# Patient Record
Sex: Female | Born: 1970 | Race: White | Hispanic: No | Marital: Married | Smoking: Former smoker
Health system: Southern US, Community
[De-identification: ages and names within clinical notes are randomized; demographics above are authoritative.]

## PROBLEM LIST (undated history)

## (undated) DIAGNOSIS — T7840XA Allergy, unspecified, initial encounter: Secondary | ICD-10-CM

## (undated) DIAGNOSIS — G5602 Carpal tunnel syndrome, left upper limb: Secondary | ICD-10-CM

## (undated) DIAGNOSIS — R7611 Nonspecific reaction to tuberculin skin test without active tuberculosis: Secondary | ICD-10-CM

## (undated) DIAGNOSIS — K635 Polyp of colon: Secondary | ICD-10-CM

## (undated) DIAGNOSIS — B019 Varicella without complication: Secondary | ICD-10-CM

## (undated) DIAGNOSIS — M1990 Unspecified osteoarthritis, unspecified site: Secondary | ICD-10-CM

## (undated) DIAGNOSIS — Z87891 Personal history of nicotine dependence: Secondary | ICD-10-CM

## (undated) DIAGNOSIS — E782 Mixed hyperlipidemia: Secondary | ICD-10-CM

## (undated) DIAGNOSIS — M5412 Radiculopathy, cervical region: Secondary | ICD-10-CM

## (undated) HISTORY — DX: Varicella without complication: B01.9

## (undated) HISTORY — PX: WISDOM TOOTH EXTRACTION: SHX21

## (undated) HISTORY — DX: Unspecified osteoarthritis, unspecified site: M19.90

## (undated) HISTORY — PX: OTHER SURGICAL HISTORY: SHX169

## (undated) HISTORY — DX: Nonspecific reaction to tuberculin skin test without active tuberculosis: R76.11

## (undated) HISTORY — DX: Allergy, unspecified, initial encounter: T78.40XA

---

## 1998-05-27 HISTORY — PX: WRIST GANGLION EXCISION: SUR520

## 2014-09-20 ENCOUNTER — Ambulatory Visit (INDEPENDENT_AMBULATORY_CARE_PROVIDER_SITE_OTHER): Payer: BC Managed Care – PPO | Admitting: Internal Medicine

## 2014-09-20 ENCOUNTER — Encounter: Payer: Self-pay | Admitting: Internal Medicine

## 2014-09-20 VITALS — BP 106/68 | HR 60 | Temp 98.6°F | Ht 62.5 in | Wt 143.0 lb

## 2014-09-20 DIAGNOSIS — M1990 Unspecified osteoarthritis, unspecified site: Secondary | ICD-10-CM | POA: Insufficient documentation

## 2014-09-20 DIAGNOSIS — Z0000 Encounter for general adult medical examination without abnormal findings: Secondary | ICD-10-CM

## 2014-09-20 DIAGNOSIS — Z111 Encounter for screening for respiratory tuberculosis: Secondary | ICD-10-CM

## 2014-09-20 DIAGNOSIS — J302 Other seasonal allergic rhinitis: Secondary | ICD-10-CM

## 2014-09-20 DIAGNOSIS — E663 Overweight: Secondary | ICD-10-CM | POA: Diagnosis not present

## 2014-09-20 LAB — COMPREHENSIVE METABOLIC PANEL
ALBUMIN: 4.5 g/dL (ref 3.5–5.2)
ALK PHOS: 43 U/L (ref 39–117)
ALT: 16 U/L (ref 0–35)
AST: 23 U/L (ref 0–37)
BUN: 9 mg/dL (ref 6–23)
CHLORIDE: 101 meq/L (ref 96–112)
CO2: 32 mEq/L (ref 19–32)
Calcium: 9.4 mg/dL (ref 8.4–10.5)
Creatinine, Ser: 0.66 mg/dL (ref 0.40–1.20)
GFR: 104.5 mL/min (ref >60.00)
Glucose, Bld: 89 mg/dL (ref 70–99)
Potassium: 4.2 mEq/L (ref 3.5–5.1)
Sodium: 136 mEq/L (ref 135–145)
Total Bilirubin: 0.6 mg/dL (ref 0.2–1.2)
Total Protein: 7.3 g/dL (ref 6.0–8.3)

## 2014-09-20 LAB — CBC
HCT: 42.8 % (ref 36.0–46.0)
HEMOGLOBIN: 14.6 g/dL (ref 12.0–15.0)
MCHC: 34.1 g/dL (ref 30.0–36.0)
MCV: 92.4 fl (ref 78.0–100.0)
Platelets: 216 10*3/uL (ref 150.0–400.0)
RBC: 4.63 Mil/uL (ref 3.87–5.11)
RDW: 13.2 % (ref 11.5–15.5)
WBC: 4 10*3/uL (ref 4.0–10.5)

## 2014-09-20 LAB — LIPID PANEL
Cholesterol: 192 mg/dL (ref 0–200)
HDL: 86.6 mg/dL (ref >39.00)
LDL CALC: 90 mg/dL (ref 0–99)
NonHDL: 105.4
TRIGLYCERIDES: 76 mg/dL (ref 0.0–149.0)
Total CHOL/HDL Ratio: 2
VLDL: 15.2 mg/dL (ref 0.0–40.0)

## 2014-09-20 NOTE — Progress Notes (Signed)
Pre visit review using our clinic review tool, if applicable. No additional management support is needed unless otherwise documented below in the visit note. 

## 2014-09-20 NOTE — Assessment & Plan Note (Signed)
Controlled with Benadryl prn

## 2014-09-20 NOTE — Assessment & Plan Note (Signed)
Controlled with Ibuprofen prn

## 2014-09-20 NOTE — Progress Notes (Signed)
HPI  Pt presents to the clinic today to establish care and for management of the conditions listed below. She also needs a physical form filled out for her employment, and she would like to get that done to day if possible.  Arthritis: mainly in her neck, shoulders and hands. Will occasionally take Ibuprofen with good relief.  Seasonal Allergies: Worse in the spring and in the fall. She takes Benadryl as needed if they are really bad.  Flu: > 2 years ago Tetanus: < 10 years ago LMP:09/16/14 still regular Pap Smear: 2011 Mammogram: never Vision Screening: 2012 Dentist: annually  Past Medical History  Diagnosis Date  . Arthritis   . Chicken pox   . Allergy   . Positive TB test     Current Outpatient Prescriptions  Medication Sig Dispense Refill  . Melatonin 5 MG CAPS Take 1 capsule by mouth at bedtime as needed and may repeat dose one time if needed.    . Multiple Vitamins-Minerals (MULTIVITAMIN ADULT PO) Take 1 capsule by mouth daily.    . NON FORMULARY Take 1 capsule by mouth daily. Supplement Holy Basil     No current facility-administered medications for this visit.    Allergies  Allergen Reactions  . Naproxen Sodium [Naproxen] Other (See Comments)    indigestion     Family History  Problem Relation Age of Onset  . Diabetes Mother   . Arthritis Father   . Cancer Father     Lung  . Arthritis Paternal Grandmother   . Cancer Paternal Grandmother     Lung  . Diabetes Paternal Grandmother     History   Social History  . Marital Status: Single    Spouse Name: N/A  . Number of Children: N/A  . Years of Education: N/A   Occupational History  . Not on file.   Social History Main Topics  . Smoking status: Former Research scientist (life sciences)  . Smokeless tobacco: Never Used     Comment: quit 8 years ago  . Alcohol Use: No     Comment: occasional  . Drug Use: No  . Sexual Activity: Not on file   Other Topics Concern  . Not on file   Social History Narrative  . No narrative  on file    ROS:  Constitutional: Denies fever, malaise, fatigue, headache or abrupt weight changes.  HEENT: Denies eye pain, eye redness, ear pain, ringing in the ears, wax buildup, runny nose, nasal congestion, bloody nose, or sore throat. Respiratory: Denies difficulty breathing, shortness of breath, cough or sputum production.   Cardiovascular: Denies chest pain, chest tightness, palpitations or swelling in the hands or feet.  Gastrointestinal: Denies abdominal pain, bloating, constipation, diarrhea or blood in the stool.  GU: Denies frequency, urgency, pain with urination, blood in urine, odor or discharge. Musculoskeletal: Denies decrease in range of motion, difficulty with gait, muscle pain or joint pain and swelling.  Skin: Denies redness, rashes, lesions or ulcercations.  Neurological: Denies dizziness, difficulty with memory, difficulty with speech or problems with balance and coordination.  Psych: Denies anxiety, depression, SI/HI.  No other specific complaints in a complete review of systems (except as listed in HPI above).  PE:  BP 106/68 mmHg  Pulse 60  Temp(Src) 98.6 F (37 C) (Oral)  Ht 5' 2.5" (1.588 m)  Wt 143 lb (64.864 kg)  BMI 25.72 kg/m2  SpO2 99%  LMP 09/14/2014  Wt Readings from Last 3 Encounters:  09/20/14 143 lb (64.864 kg)    General:  Appears her stated age, well developed, well nourished in NAD. HEENT: Head: normal shape and size; Eyes: sclera white, no icterus, conjunctiva pink, PERRLA and EOMs intact; Ears: Tm's gray and intact, normal light reflex; Nose: mucosa pink and moist, septum midline; Throat/Mouth: Teeth present, mucosa pink and moist, no lesions or ulcerations noted.  Neck: Neck supple, trachea midline. No masses, lumps or thyromegaly present.  Cardiovascular: Normal rate and rhythm. S1,S2 noted.  No murmur, rubs or gallops noted. No JVD or BLE edema. No carotid bruits noted. Pulmonary/Chest: Normal effort and positive vesicular breath  sounds. No respiratory distress. No wheezes, rales or ronchi noted.  Abdomen: Soft and nontender. Normal bowel sounds, no bruits noted. No distention or masses noted. Liver, spleen and kidneys non palpable. Musculoskeletal: Strength 5/5 BUE/BLE. No signs of joint swelling. No difficulty with gait.  Neurological: Alert and oriented. Cranial nerves II-XII grossly intact. Coordination normal.  Psychiatric: Mood and affect normal. Behavior is normal. Judgment and thought content normal.     Assessment and Plan:  Preventative Health Maintenance:  Encouraged her to work on diet and exercise She is slightly overweight, she wants to be referred to a nutritionist, referral placed today. Will check CBC, CMET and Lipid Profile today Will check Quantiferon TB assay for the form that needs to be filled out (She has a history of positive TB skin test), chest xray's yearly have been negative) Vision screen today Will refer to [facility] for Mammogram and Pap Smear  RTC in 1 year or sooner if needed

## 2014-09-20 NOTE — Patient Instructions (Signed)

## 2014-09-23 LAB — QUANTIFERON TB GOLD ASSAY (BLOOD)
Interferon Gamma Release Assay: NEGATIVE
QUANTIFERON TB AG MINUS NIL: 0 [IU]/mL
Quantiferon Nil Value: 0.09 IU/mL
TB Ag value: 0.04 IU/mL

## 2015-03-31 ENCOUNTER — Encounter: Payer: Self-pay | Admitting: Internal Medicine

## 2015-03-31 ENCOUNTER — Ambulatory Visit (INDEPENDENT_AMBULATORY_CARE_PROVIDER_SITE_OTHER): Payer: BC Managed Care – PPO | Admitting: Internal Medicine

## 2015-03-31 VITALS — BP 122/72 | HR 65 | Temp 98.5°F | Resp 12 | Wt 138.5 lb

## 2015-03-31 DIAGNOSIS — J209 Acute bronchitis, unspecified: Secondary | ICD-10-CM | POA: Insufficient documentation

## 2015-03-31 MED ORDER — AZITHROMYCIN 250 MG PO TABS: ORAL_TABLET | ORAL | Status: DC

## 2015-03-31 NOTE — Assessment & Plan Note (Signed)
From time course, atypical like Mycoplasma is likely Discussed supportive care Will try z-pak after discussion of pros/cons

## 2015-03-31 NOTE — Progress Notes (Signed)
   Subjective:    Patient ID: Tonya Ruiz, female    DOB: [birth year], 44 y.o.   MRN: [id number]  HPI Here due to respiratory illness Symptoms started about 4 weeks ago--coming and going Voice goes away-then improves Sore throat only sporadically Gets stuffy and dry cough No chest congestion Some drainage No headaches No ear pain--but occasional pressure No fever At times, excessive cough--but dry Some wheezing before-not now No SOB  Has tried some OTC meds-- slight help with cough med  Current Outpatient Prescriptions on File Prior to Visit  Medication Sig Dispense Refill  . Melatonin 5 MG CAPS Take 1 capsule by mouth at bedtime as needed and may repeat dose one time if needed.    . Multiple Vitamins-Minerals (MULTIVITAMIN ADULT PO) Take 1 capsule by mouth daily.    . NON FORMULARY Take 1 capsule by mouth daily. Supplement Holy Basil     No current facility-administered medications on file prior to visit.    Allergies  Allergen Reactions  . Naproxen Sodium [Naproxen] Other (See Comments)    indigestion     Past Medical History  Diagnosis Date  . Arthritis   . Chicken pox   . Allergy   . Positive TB test     Past Surgical History  Procedure Laterality Date  . Wrist ganglion excision Left 2000  . Wisdom teeth      Family History  Problem Relation Age of Onset  . Diabetes Mother   . Arthritis Father   . Cancer Father     Lung  . Arthritis Paternal Grandmother   . Cancer Paternal Grandmother     Lung  . Diabetes Paternal Grandmother     Social History   Social History  . Marital Status: Single    Spouse Name: N/A  . Number of Children: N/A  . Years of Education: N/A   Occupational History  . Not on file.   Social History Main Topics  . Smoking status: Former Research scientist (life sciences)  . Smokeless tobacco: Never Used     Comment: quit 8 years ago  . Alcohol Use: No     Comment: occasional  . Drug Use: No  . Sexual Activity: Yes   Other Topics Concern  .  Not on file   Social History Narrative   Review of Systems No rash Loose stools at start---not recently No vomiting Appetite is okay    Objective:   Physical Exam  Constitutional: She appears well-developed and well-nourished. No distress.  HENT:  Mouth/Throat: Oropharynx is clear and moist. No oropharyngeal exudate.  No sinus tenderness Mild nasal swelling TMs normal  Neck: Normal range of motion. Neck supple.  Pulmonary/Chest: Effort normal and breath sounds normal. No respiratory distress. She has no wheezes. She has no rales.  Lymphadenopathy:    She has no cervical adenopathy.          Assessment & Plan:

## 2015-03-31 NOTE — Progress Notes (Signed)
Pre visit review using our clinic review tool, if applicable. No additional management support is needed unless otherwise documented below in the visit note. 

## 2015-11-13 ENCOUNTER — Encounter: Payer: Self-pay | Admitting: Internal Medicine

## 2015-11-13 ENCOUNTER — Other Ambulatory Visit (HOSPITAL_COMMUNITY)
Admission: RE | Admit: 2015-11-13 | Discharge: 2015-11-13 | Disposition: A | Discharge status: Home / Self Care | Payer: BC Managed Care – PPO | Source: Ambulatory Visit | Attending: Internal Medicine | Admitting: Internal Medicine

## 2015-11-13 ENCOUNTER — Ambulatory Visit (INDEPENDENT_AMBULATORY_CARE_PROVIDER_SITE_OTHER): Payer: BC Managed Care – PPO | Admitting: Internal Medicine

## 2015-11-13 VITALS — BP 116/70 | HR 65 | Temp 98.6°F | Ht 61.5 in | Wt 140.8 lb

## 2015-11-13 DIAGNOSIS — Z0001 Encounter for general adult medical examination with abnormal findings: Secondary | ICD-10-CM

## 2015-11-13 DIAGNOSIS — L989 Disorder of the skin and subcutaneous tissue, unspecified: Secondary | ICD-10-CM

## 2015-11-13 DIAGNOSIS — Z111 Encounter for screening for respiratory tuberculosis: Secondary | ICD-10-CM

## 2015-11-13 DIAGNOSIS — Z01419 Encounter for gynecological examination (general) (routine) without abnormal findings: Secondary | ICD-10-CM

## 2015-11-13 DIAGNOSIS — Z1151 Encounter for screening for human papillomavirus (HPV): Secondary | ICD-10-CM | POA: Insufficient documentation

## 2015-11-13 LAB — LIPID PANEL
CHOL/HDL RATIO: 2
CHOLESTEROL: 171 mg/dL (ref 0–200)
HDL: 75.6 mg/dL (ref >39.00)
LDL CALC: 79 mg/dL (ref 0–99)
NonHDL: 95.72
Triglycerides: 85 mg/dL (ref 0.0–149.0)
VLDL: 17 mg/dL (ref 0.0–40.0)

## 2015-11-13 LAB — COMPREHENSIVE METABOLIC PANEL
ALBUMIN: 4.2 g/dL (ref 3.5–5.2)
ALT: 11 U/L (ref 0–35)
AST: 17 U/L (ref 0–37)
Alkaline Phosphatase: 39 U/L (ref 39–117)
BUN: 10 mg/dL (ref 6–23)
CHLORIDE: 102 meq/L (ref 96–112)
CO2: 28 mEq/L (ref 19–32)
CREATININE: 0.66 mg/dL (ref 0.40–1.20)
Calcium: 9.1 mg/dL (ref 8.4–10.5)
GFR: 103.92 mL/min (ref >60.00)
Glucose, Bld: 87 mg/dL (ref 70–99)
Potassium: 4.2 mEq/L (ref 3.5–5.1)
SODIUM: 138 meq/L (ref 135–145)
TOTAL PROTEIN: 7.1 g/dL (ref 6.0–8.3)
Total Bilirubin: 0.6 mg/dL (ref 0.2–1.2)

## 2015-11-13 LAB — CBC
HEMATOCRIT: 40.4 % (ref 36.0–46.0)
Hemoglobin: 13.9 g/dL (ref 12.0–15.0)
MCHC: 34.3 g/dL (ref 30.0–36.0)
MCV: 92.8 fl (ref 78.0–100.0)
PLATELETS: 216 10*3/uL (ref 150.0–400.0)
RBC: 4.35 Mil/uL (ref 3.87–5.11)
RDW: 12.9 % (ref 11.5–15.5)
WBC: 6.2 10*3/uL (ref 4.0–10.5)

## 2015-11-13 NOTE — Patient Instructions (Signed)
Health Maintenance, Female Adopting a healthy lifestyle and getting preventive care can go a long way to promote health and wellness. Talk with your health care provider about what schedule of regular examinations is right for you. This is a good chance for you to check in with your provider about disease prevention and staying healthy. In between checkups, there are plenty of things you can do on your own. Experts have done a lot of research about which lifestyle changes and preventive measures are most likely to keep you healthy. Ask your health care provider for more information. WEIGHT AND DIET  Eat a healthy diet  Be sure to include plenty of vegetables, fruits, low-fat dairy products, and lean protein.  Do not eat a lot of foods high in solid fats, added sugars, or salt.  Get regular exercise. This is one of the most important things you can do for your health.  Most adults should exercise for at least 150 minutes each week. The exercise should increase your heart rate and make you sweat (moderate-intensity exercise).  Most adults should also do strengthening exercises at least twice a week. This is in addition to the moderate-intensity exercise.  Maintain a healthy weight  Body mass index (BMI) is a measurement that can be used to identify possible weight problems. It estimates body fat based on height and weight. Your health care provider can help determine your BMI and help you achieve or maintain a healthy weight.  For females 20 years of age and older:   A BMI below 18.5 is considered underweight.  A BMI of 18.5 to 24.9 is normal.  A BMI of 25 to 29.9 is considered overweight.  A BMI of 30 and above is considered obese.  Watch levels of cholesterol and blood lipids  You should start having your blood tested for lipids and cholesterol at 43 years of age, then have this test every 5 years.  You may need to have your cholesterol levels checked more often if:  Your lipid  or cholesterol levels are high.  You are older than 43 years of age.  You are at high risk for heart disease.  CANCER SCREENING   Lung Cancer  Lung cancer screening is recommended for adults 55-80 years old who are at high risk for lung cancer because of a history of smoking.  A yearly low-dose CT scan of the lungs is recommended for people who:  Currently smoke.  Have quit within the past 15 years.  Have at least a 30-pack-year history of smoking. A pack year is smoking an average of one pack of cigarettes a day for 1 year.  Yearly screening should continue until it has been 15 years since you quit.  Yearly screening should stop if you develop a health problem that would prevent you from having lung cancer treatment.  Breast Cancer  Practice breast self-awareness. This means understanding how your breasts normally appear and feel.  It also means doing regular breast self-exams. Let your health care provider know about any changes, no matter how small.  If you are in your 20s or 30s, you should have a clinical breast exam (CBE) by a health care provider every 1-3 years as part of a regular health exam.  If you are 40 or older, have a CBE every year. Also consider having a breast X-ray (mammogram) every year.  If you have a family history of breast cancer, talk to your health care provider about genetic screening.  If you   are at high risk for breast cancer, talk to your health care provider about having an MRI and a mammogram every year.  Breast cancer gene (BRCA) assessment is recommended for women who have family members with BRCA-related cancers. BRCA-related cancers include:  Breast.  Ovarian.  Tubal.  Peritoneal cancers.  Results of the assessment will determine the need for genetic counseling and BRCA1 and BRCA2 testing. Cervical Cancer Your health care provider may recommend that you be screened regularly for cancer of the pelvic organs (ovaries, uterus, and  vagina). This screening involves a pelvic examination, including checking for microscopic changes to the surface of your cervix (Pap test). You may be encouraged to have this screening done every 3 years, beginning at age 24.  For women ages 32-65, health care providers may recommend pelvic exams and Pap testing every 3 years, or they may recommend the Pap and pelvic exam, combined with testing for human papilloma virus (HPV), every 5 years. Some types of HPV increase your risk of cervical cancer. Testing for HPV may also be done on women of any age with unclear Pap test results.  Other health care providers may not recommend any screening for nonpregnant women who are considered low risk for pelvic cancer and who do not have symptoms. Ask your health care provider if a screening pelvic exam is right for you.  If you have had past treatment for cervical cancer or a condition that could lead to cancer, you need Pap tests and screening for cancer for at least 20 years after your treatment. If Pap tests have been discontinued, your risk factors (such as having a new sexual partner) need to be reassessed to determine if screening should resume. Some women have medical problems that increase the chance of getting cervical cancer. In these cases, your health care provider may recommend more frequent screening and Pap tests. Colorectal Cancer  This type of cancer can be detected and often prevented.  Routine colorectal cancer screening usually begins at 43 years of age and continues through 42 years of age.  Your health care provider may recommend screening at an earlier age if you have risk factors for colon cancer.  Your health care provider may also recommend using home test kits to check for hidden blood in the stool.  A small camera at the end of a tube can be used to examine your colon directly (sigmoidoscopy or colonoscopy). This is done to check for the earliest forms of colorectal  cancer.  Routine screening usually begins at age 35.  Direct examination of the colon should be repeated every 5-10 years through 43 years of age. However, you may need to be screened more often if early forms of precancerous polyps or small growths are found. Skin Cancer  Check your skin from head to toe regularly.  Tell your health care provider about any new moles or changes in moles, especially if there is a change in a mole's shape or color.  Also tell your health care provider if you have a mole that is larger than the size of a pencil eraser.  Always use sunscreen. Apply sunscreen liberally and repeatedly throughout the day.  Protect yourself by wearing long sleeves, pants, a wide-brimmed hat, and sunglasses whenever you are outside. HEART DISEASE, DIABETES, AND HIGH BLOOD PRESSURE   High blood pressure causes heart disease and increases the risk of stroke. High blood pressure is more likely to develop in:  People who have blood pressure in the high end  of the normal range (130-139/85-89 mm Hg).  People who are overweight or obese.  People who are African American.  If you are 59-23 years of age, have your blood pressure checked every 3-5 years. If you are 89 years of age or older, have your blood pressure checked every year. You should have your blood pressure measured twice--once when you are at a hospital or clinic, and once when you are not at a hospital or clinic. Record the average of the two measurements. To check your blood pressure when you are not at a hospital or clinic, you can use:  An automated blood pressure machine at a pharmacy.  A home blood pressure monitor.  If you are between 58 years and 32 years old, ask your health care provider if you should take aspirin to prevent strokes.  Have regular diabetes screenings. This involves taking a blood sample to check your fasting blood sugar level.  If you are at a normal weight and have a low risk for diabetes,  have this test once every three years after 43 years of age.  If you are overweight and have a high risk for diabetes, consider being tested at a younger age or more often. PREVENTING INFECTION  Hepatitis B  If you have a higher risk for hepatitis B, you should be screened for this virus. You are considered at high risk for hepatitis B if:  You were born in a country where hepatitis B is common. Ask your health care provider which countries are considered high risk.  Your parents were born in a high-risk country, and you have not been immunized against hepatitis B (hepatitis B vaccine).  You have HIV or AIDS.  You use needles to inject street drugs.  You live with someone who has hepatitis B.  You have had sex with someone who has hepatitis B.  You get hemodialysis treatment.  You take certain medicines for conditions, including cancer, organ transplantation, and autoimmune conditions. Hepatitis C  Blood testing is recommended for:  Everyone born from [birth year] through 1963.  Anyone with known risk factors for hepatitis C. Sexually transmitted infections (STIs)  You should be screened for sexually transmitted infections (STIs) including gonorrhea and chlamydia if:  You are sexually active and are younger than 43 years of age.  You are older than 43 years of age and your health care provider tells you that you are at risk for this type of infection.  Your sexual activity has changed since you were last screened and you are at an increased risk for chlamydia or gonorrhea. Ask your health care provider if you are at risk.  If you do not have HIV, but are at risk, it may be recommended that you take a prescription medicine daily to prevent HIV infection. This is called pre-exposure prophylaxis (PrEP). You are considered at risk if:  You are sexually active and do not regularly use condoms or know the HIV status of your partner(s).  You take drugs by injection.  You are sexually  active with a partner who has HIV. Talk with your health care provider about whether you are at high risk of being infected with HIV. If you choose to begin PrEP, you should first be tested for HIV. You should then be tested every 3 months for as long as you are taking PrEP.  PREGNANCY   If you are premenopausal and you may become pregnant, ask your health care provider about preconception counseling.  If you may  become pregnant, take 400 to 800 micrograms (mcg) of folic acid every day.  If you want to prevent pregnancy, talk to your health care provider about birth control (contraception). OSTEOPOROSIS AND MENOPAUSE   Osteoporosis is a disease in which the bones lose minerals and strength with aging. This can result in serious bone fractures. Your risk for osteoporosis can be identified using a bone density scan.  If you are 61 years of age or older, or if you are at risk for osteoporosis and fractures, ask your health care provider if you should be screened.  Ask your health care provider whether you should take a calcium or vitamin D supplement to lower your risk for osteoporosis.  Menopause may have certain physical symptoms and risks.  Hormone replacement therapy may reduce some of these symptoms and risks. Talk to your health care provider about whether hormone replacement therapy is right for you.  HOME CARE INSTRUCTIONS   Schedule regular health, dental, and eye exams.  Stay current with your immunizations.   Do not use any tobacco products including cigarettes, chewing tobacco, or electronic cigarettes.  If you are pregnant, do not drink alcohol.  If you are breastfeeding, limit how much and how often you drink alcohol.  Limit alcohol intake to no more than 1 drink per day for nonpregnant women. One drink equals 12 ounces of beer, 5 ounces of wine, or 1 ounces of hard liquor.  Do not use street drugs.  Do not share needles.  Ask your health care provider for help if  you need support or information about quitting drugs.  Tell your health care provider if you often feel depressed.  Tell your health care provider if you have ever been abused or do not feel safe at home.   This information is not intended to replace advice given to you by your health care provider. Make sure you discuss any questions you have with your health care provider.   Document Released: 11/26/2010 Document Revised: 06/03/2014 Document Reviewed: 04/14/2013 Elsevier Interactive Patient Education [employer].

## 2015-11-13 NOTE — Addendum Note (Signed)
Addended by: Ellamae Sia on: 11/13/2015 08:55 AM   Modules accepted: Miquel Dunn

## 2015-11-13 NOTE — Addendum Note (Signed)
Addended by: Lurlean Nanny on: 11/13/2015 11:35 AM   Modules accepted: Orders, SmartSet

## 2015-11-13 NOTE — Progress Notes (Signed)
HPI  Pt presents to the clinic today for her annual exam  Flu: 05/2012 Tetanus: 05/2012 Pap Smear: 2011 Mammogram: never Vision Screening: 2012 Dentist: annually  Diet: She does eat meat. She consumes fruits and veggies daily. She tries to avoid fried foods. She drinks mostly coffee and water. Exercise: None  Past Medical History  Diagnosis Date  . Arthritis   . Chicken pox   . Allergy   . Positive TB test     Current Outpatient Prescriptions  Medication Sig Dispense Refill  . Melatonin 5 MG CAPS Take 1 capsule by mouth at bedtime as needed and may repeat dose one time if needed.    . Multiple Vitamins-Minerals (MULTIVITAMIN ADULT PO) Take 1 capsule by mouth daily.    . NON FORMULARY Take 1 capsule by mouth daily. Supplement Holy Basil     No current facility-administered medications for this visit.    Allergies  Allergen Reactions  . Naproxen Sodium [Naproxen] Other (See Comments)    indigestion     Family History  Problem Relation Age of Onset  . Diabetes Mother   . Arthritis Father   . Cancer Father     Lung  . Arthritis Paternal Grandmother   . Cancer Paternal Grandmother     Lung  . Diabetes Paternal Grandmother     Social History   Social History  . Marital Status: Single    Spouse Name: N/A  . Number of Children: N/A  . Years of Education: N/A   Occupational History  . Not on file.   Social History Main Topics  . Smoking status: Former Research scientist (life sciences)  . Smokeless tobacco: Never Used     Comment: quit 8 years ago  . Alcohol Use: No     Comment: occasional  . Drug Use: No  . Sexual Activity: Yes   Other Topics Concern  . Not on file   Social History Narrative    ROS:  Constitutional: Denies fever, malaise, fatigue, headache or abrupt weight changes.  HEENT: Denies eye pain, eye redness, ear pain, ringing in the ears, wax buildup, runny nose, nasal congestion, bloody nose, or sore throat. Respiratory: Denies difficulty breathing, shortness of  breath, cough or sputum production.   Cardiovascular: Denies chest pain, chest tightness, palpitations or swelling in the hands or feet.  Gastrointestinal: Denies abdominal pain, bloating, constipation, diarrhea or blood in the stool.  GU: Denies frequency, urgency, pain with urination, blood in urine, odor or discharge. Musculoskeletal: Denies decrease in range of motion, difficulty with gait, muscle pain or joint pain and swelling.  Skin: Pt reports skin lesion, left lower leg. Denies redness, rashes, lesions or ulcercations.  Neurological: Denies dizziness, difficulty with memory, difficulty with speech or problems with balance and coordination.  Psych: Denies anxiety, depression, SI/HI.  No other specific complaints in a complete review of systems (except as listed in HPI above).  PE:  BP 116/70 mmHg  Pulse 65  Temp(Src) 98.6 F (37 C) (Oral)  Ht 5' 1.5" (1.562 m)  Wt 140 lb 12 oz (63.844 kg)  BMI 26.17 kg/m2  SpO2 98%  LMP 10/27/2015   Wt Readings from Last 3 Encounters:  03/31/15 138 lb 8 oz (62.823 kg)  09/20/14 143 lb (64.864 kg)    General: Appears her stated age, well developed, well nourished in NAD. Skin: Dry and intact. Round, 0.5 cm hyperpigmented lesion noted to left lateral calf. HEENT: Head: normal shape and size; Eyes: sclera white, no icterus, conjunctiva pink, PERRLA and  EOMs intact; Ears: Tm's gray and intact, normal light reflex; Throat/Mouth: Teeth present, mucosa pink and moist, no lesions or ulcerations noted.  Neck: Neck supple, trachea midline. No masses, lumps or thyromegaly present.  Cardiovascular: Normal rate and rhythm. S1,S2 noted.  No murmur, rubs or gallops noted. No JVD or BLE edema.  Pulmonary/Chest: Normal effort and positive vesicular breath sounds. No respiratory distress. No wheezes, rales or ronchi noted.  Abdomen: Soft and nontender. Normal bowel soundsd. No distention or masses noted. Liver, spleen and kidneys non  palpable. Musculoskeletal: Strength 5/5 BUE/BLE. No signs of joint swelling. No difficulty with gait.  Neurological: Alert and oriented. Cranial nerves II-XII grossly intact. Coordination normal.  Psychiatric: Mood and affect normal. Behavior is normal. Judgment and thought content normal.     Assessment and Plan:  Preventative Health Maintenance:  Encouraged her to work on diet and exercise Will check CBC, CMET and Lipid Profile today Will check Quantiferon TB assay (She has a history of positive TB skin test), chest xray's yearly have been negative) She declines mammogram Pap smear today Encouraged her to see an eye doctor and dentist at least annually  RTC in 1 year or sooner if needed

## 2015-11-13 NOTE — Progress Notes (Signed)
Pre visit review using our clinic review tool, if applicable. No additional management support is needed unless otherwise documented below in the visit note. 

## 2015-11-14 LAB — CYTOLOGY - PAP

## 2015-11-15 LAB — QUANTIFERON TB GOLD ASSAY (BLOOD)
INTERFERON GAMMA RELEASE ASSAY: NEGATIVE
MITOGEN-NIL SO: 9.44 [IU]/mL
QUANTIFERON NIL VALUE: 0.03 [IU]/mL
Quantiferon Tb Ag Minus Nil Value: <0 IU/mL

## 2021-02-28 ENCOUNTER — Ambulatory Visit: Payer: BC Managed Care – PPO | Admitting: Family Medicine

## 2021-03-21 ENCOUNTER — Ambulatory Visit: Payer: BC Managed Care – PPO | Admitting: Family Medicine

## 2021-07-24 ENCOUNTER — Ambulatory Visit (INDEPENDENT_AMBULATORY_CARE_PROVIDER_SITE_OTHER): Payer: BC Managed Care – PPO | Admitting: Family

## 2021-07-24 ENCOUNTER — Other Ambulatory Visit: Payer: Self-pay

## 2021-07-24 ENCOUNTER — Encounter: Payer: Self-pay | Admitting: Family

## 2021-07-24 VITALS — BP 102/64 | HR 68 | Ht 62.0 in | Wt 139.0 lb

## 2021-07-24 DIAGNOSIS — I499 Cardiac arrhythmia, unspecified: Secondary | ICD-10-CM

## 2021-07-24 DIAGNOSIS — M1990 Unspecified osteoarthritis, unspecified site: Secondary | ICD-10-CM

## 2021-07-24 DIAGNOSIS — E782 Mixed hyperlipidemia: Secondary | ICD-10-CM | POA: Diagnosis not present

## 2021-07-24 DIAGNOSIS — L249 Irritant contact dermatitis, unspecified cause: Secondary | ICD-10-CM

## 2021-07-24 DIAGNOSIS — D492 Neoplasm of unspecified behavior of bone, soft tissue, and skin: Secondary | ICD-10-CM

## 2021-07-24 MED ORDER — TRIAMCINOLONE ACETONIDE 0.1 % EX CREA: 1.0000 "application " | TOPICAL_CREAM | Freq: Two times a day (BID) | CUTANEOUS | 0 refills | Status: DC

## 2021-07-24 NOTE — Patient Instructions (Addendum)
A referral was placed today for dermatology.  Please let us know if you have not heard back within 1 week about your referral.  I did send you some steroid cream for your area of itching/ raised bumps. See if this helps to improve it.   It was a pleasure seeing you today! Please do not hesitate to reach out with any questions and or concerns.  Regards,   Eugenia Pancoast FNP-C

## 2021-07-24 NOTE — Progress Notes (Signed)
New Patient Office Visit  Subjective:  Patient ID: Tonya Ruiz, female    DOB: [birth year]  Age: 52 y.o. MRN: [id number]  CC: No chief complaint on file.   HPI Tonya Ruiz is here to establish care as a new patient as last office visit with [facility]CN was in 2017.  Prior provider Tonya Baity, NP  Pt is without acute concerns.   Pap 2017, overdue.   Concerned about a spot at the top of back of her neck , where the skin changes how it feels.Marland Kitchen occasionally 'scabby' itchy and/or feels like a mole that she can feel that she doesn't remember being there before. Noticed about two months ago.   Irregular rhythm heard on exam, occasionally feels heart skips a beat. Does drink 2-3 cups of coffee day, drinks about a gallon of water a day. No chest pain. No sob. Very rare chocolate. No licorice.   chronic concerns:  HLD: 2019, was borderline with her school system health clinic. Hasn't checked since, minimal fried fatty foods, some red meats, but good amount of veggies and low carb diet. Cheeses only slight.   Seasonal allergies: not an issue at current. Benadryl when she feels it.    Past Medical History:  Diagnosis Date   Allergy    Arthritis    Chicken pox    Positive TB test     Past Surgical History:  Procedure Laterality Date   wisdom teeth     WRIST GANGLION EXCISION Left 2000    Family History  Problem Relation Age of Onset   Diabetes type II Mother    Arthritis Father    Cancer Father        Lung cancer, in remission   Arthritis Paternal Grandmother    Cancer Paternal Grandmother        lung cancer   Diabetes Paternal Grandmother    Celiac disease Paternal Grandmother    Heart attack Paternal Grandmother        self induced/ accidental overdose    Social History   Socioeconomic History   Marital status: Married    Spouse name: Not on file   Number of children: 1   Years of education: Not on file   Highest education level: Not on file  Occupational History    Occupation: Pharmacist, hospital    Comment: elementary, 2nd  Tobacco Use   Smoking status: Former    Packs/day: 1.00    Years: 12.00    Pack years: 12.00    Types: Cigarettes    Quit date: 2010    Years since quitting: 13.1   Smokeless tobacco: Never   Tobacco comments:    quit 8 years ago  Substance and Sexual Activity   Alcohol use: Not Currently    Comment: very rarely   Drug use: No   Sexual activity: Yes    Partners: Male    Comment: husband vasectomy  Other Topics Concern   Not on file  Social History Narrative   Boy - 51 y/o    Social Determinants of Health   Financial Resource Strain: Not on file  Food Insecurity: Not on file  Transportation Needs: Not on file  Physical Activity: Not on file  Stress: Not on file  Social Connections: Not on file  Intimate Partner Violence: Not on file    Outpatient Medications Prior to Visit  Medication Sig Dispense Refill   Multiple Vitamins-Minerals (MULTIVITAMIN ADULT PO) Take 1 capsule by mouth daily.     No  facility-administered medications prior to visit.    Allergies  Allergen Reactions   Naproxen Sodium [Naproxen] Other (See Comments)    indigestion     ROS Review of Systems  Constitutional:  Negative for chills and fatigue.  Respiratory:  Negative for cough and shortness of breath.   Cardiovascular:  Positive for palpitations (occasionally feels heart skip a beat). Negative for chest pain and leg swelling.  Gastrointestinal:  Negative for diarrhea and nausea.  Genitourinary:  Negative for difficulty urinating.  Skin:  Positive for rash (upper neck with itchy rash and pt concerned with white modle that she says is scratchy scaly at times).  Psychiatric/Behavioral:  Negative for agitation and sleep disturbance.   All other systems reviewed and are negative.      Objective:    Physical Exam Constitutional:      General: She is not in acute distress.    Appearance: Normal appearance. She is normal weight. She is  not ill-appearing, toxic-appearing or diaphoretic.  HENT:     Head: Normocephalic.  Cardiovascular:     Rate and Rhythm: Normal rate and regular rhythm. Occasional Extrasystoles are present.    Heart sounds: Normal heart sounds.  Pulmonary:     Effort: Pulmonary effort is normal.     Breath sounds: Normal breath sounds.  Abdominal:     General: Abdomen is flat.  Skin:    General: Skin is warm.     Findings: Lesion (skin colored nodule, mole vs BCC) and rash (papular rash with base erythema with mild exocration base of posterior neck) present.  Neurological:     General: No focal deficit present.     Mental Status: She is alert and oriented to person, place, and time.  Psychiatric:        Mood and Affect: Mood normal.        Behavior: Behavior normal.        Thought Content: Thought content normal.        Judgment: Judgment normal.        BP 102/64    Pulse 68    Ht 5' 2"  (1.575 m)    Wt 139 lb (63 kg)    LMP 07/12/2021    SpO2 97%    BMI 25.42 kg/m  Wt Readings from Last 3 Encounters:  07/24/21 139 lb (63 kg)  11/13/15 140 lb 12 oz (63.8 kg)  03/31/15 138 lb 8 oz (62.8 kg)     Health Maintenance Due  Topic Date Due   COVID-19 Vaccine (1) Never done   HIV Screening  Never done   Hepatitis C Screening  Never done   COLONOSCOPY (Pts 45-71yr Insurance coverage will need to be confirmed)  Never done   PAP SMEAR-Modifier  11/13/2018   INFLUENZA VACCINE  12/25/2020    There are no preventive care reminders to display for this patient.  No results found for: TSH Lab Results  Component Value Date   WBC 6.2 11/13/2015   HGB 13.9 11/13/2015   HCT 40.4 11/13/2015   MCV 92.8 11/13/2015   PLT 216.0 11/13/2015   Lab Results  Component Value Date   NA 138 11/13/2015   K 4.2 11/13/2015   CO2 28 11/13/2015   GLUCOSE 87 11/13/2015   BUN 10 11/13/2015   CREATININE 0.66 11/13/2015   BILITOT 0.6 11/13/2015   ALKPHOS 39 11/13/2015   AST 17 11/13/2015   ALT 11 11/13/2015    PROT 7.1 11/13/2015   ALBUMIN 4.2 11/13/2015  CALCIUM 9.1 11/13/2015   GFR 103.92 11/13/2015   Lab Results  Component Value Date   CHOL 171 11/13/2015   Lab Results  Component Value Date   HDL 75.60 11/13/2015   Lab Results  Component Value Date   LDLCALC 79 11/13/2015   Lab Results  Component Value Date   TRIG 85.0 11/13/2015   Lab Results  Component Value Date   CHOLHDL 2 11/13/2015   No results found for: HGBA1C    Assessment & Plan:   Problem List Items Addressed This Visit       Musculoskeletal and Integument   Arthritis - Primary   Irritant contact dermatitis    Triamcinolone cream prescribed patient to look at cosmetics endocrinologist to see if there was any recent changes.  Could also be keratosis pilaris so I advised patient to apply lotion to site nightly to keep coverage      Relevant Medications   triamcinolone cream (KENALOG) 0.1 %   Abnormal skin growth    Basal cell carcinoma versus mole.  Referral to dermatology to assess more appropriately.      Relevant Orders   Ambulatory referral to Dermatology     Other   Mixed hyperlipidemia    Patient to continue with low-cholesterol diet and exercise as tolerated.      Irregular heart beat    Occasional extra beats possible PACs.  EKG in office however failure to catch PAC on rhythm strip as normal sinus rhythm.  Patient does admit to ingesting a lot of coffee throughout the day advised patient to discontinue caffeine and/or titrate down to maybe 1 cup a day and see if frequency of palpitations decreases.  If this does not improve will refer cardiology for Holter monitor.  Total time spent in office was already 6 minutes performing EKG interpreting EKG and going over acute concerns as well as chronic concerns in the history.      Relevant Orders   EKG 12-Lead (Completed)    Meds ordered this encounter  Medications   triamcinolone cream (KENALOG) 0.1 %    Sig: Apply 1 application topically  2 (two) times daily.    Dispense:  30 g    Refill:  0    Order Specific Question:   Supervising Provider    Answer:   Diona Browner, AMY E [[id number]]    Follow-up: Return in about 3 months (around 10/21/2021) for schedule PAP/CPE , pt to be fasting at visit for labs preferably in the morning.    Eugenia Pancoast, FNP

## 2021-07-27 DIAGNOSIS — L249 Irritant contact dermatitis, unspecified cause: Secondary | ICD-10-CM | POA: Insufficient documentation

## 2021-07-27 DIAGNOSIS — D492 Neoplasm of unspecified behavior of bone, soft tissue, and skin: Secondary | ICD-10-CM | POA: Insufficient documentation

## 2021-07-27 DIAGNOSIS — I499 Cardiac arrhythmia, unspecified: Secondary | ICD-10-CM | POA: Insufficient documentation

## 2021-07-27 NOTE — Assessment & Plan Note (Signed)
Basal cell carcinoma versus mole.  Referral to dermatology to assess more appropriately. ?

## 2021-07-27 NOTE — Assessment & Plan Note (Signed)
Patient to continue with low-cholesterol diet and exercise as tolerated. ?

## 2021-07-27 NOTE — Assessment & Plan Note (Signed)
Triamcinolone cream prescribed patient to look at cosmetics endocrinologist to see if there was any recent changes.  Could also be keratosis pilaris so I advised patient to apply lotion to site nightly to keep coverage ?

## 2021-07-27 NOTE — Assessment & Plan Note (Addendum)
Occasional extra beats possible PACs.  EKG in office however failure to catch PAC on rhythm strip as normal sinus rhythm.  Patient does admit to ingesting a lot of coffee throughout the day advised patient to discontinue caffeine and/or titrate down to maybe 1 cup a day and see if frequency of palpitations decreases.  If this does not improve will refer cardiology for Holter monitor. ? ?Total time spent in office was already 6 minutes performing EKG interpreting EKG and going over acute concerns as well as chronic concerns in the history. ?

## 2021-08-03 ENCOUNTER — Ambulatory Visit: Payer: BC Managed Care – PPO | Admitting: Family

## 2021-08-24 ENCOUNTER — Other Ambulatory Visit: Payer: Self-pay

## 2021-08-24 ENCOUNTER — Ambulatory Visit (INDEPENDENT_AMBULATORY_CARE_PROVIDER_SITE_OTHER)
Admission: RE | Admit: 2021-08-24 | Discharge: 2021-08-24 | Disposition: A | Discharge status: Home / Self Care | Payer: BC Managed Care – PPO | Source: Ambulatory Visit | Attending: Family | Admitting: Family

## 2021-08-24 ENCOUNTER — Ambulatory Visit: Payer: BC Managed Care – PPO | Admitting: Family

## 2021-08-24 ENCOUNTER — Encounter: Payer: Self-pay | Admitting: Family

## 2021-08-24 VITALS — BP 98/60 | Temp 98.7°F | Resp 16 | Ht 62.0 in | Wt 136.2 lb

## 2021-08-24 DIAGNOSIS — Z0001 Encounter for general adult medical examination with abnormal findings: Secondary | ICD-10-CM | POA: Diagnosis not present

## 2021-08-24 DIAGNOSIS — G8929 Other chronic pain: Secondary | ICD-10-CM | POA: Diagnosis not present

## 2021-08-24 DIAGNOSIS — K649 Unspecified hemorrhoids: Secondary | ICD-10-CM

## 2021-08-24 DIAGNOSIS — Z1231 Encounter for screening mammogram for malignant neoplasm of breast: Secondary | ICD-10-CM | POA: Insufficient documentation

## 2021-08-24 DIAGNOSIS — M542 Cervicalgia: Secondary | ICD-10-CM

## 2021-08-24 DIAGNOSIS — Z1211 Encounter for screening for malignant neoplasm of colon: Secondary | ICD-10-CM | POA: Insufficient documentation

## 2021-08-24 DIAGNOSIS — M62838 Other muscle spasm: Secondary | ICD-10-CM | POA: Insufficient documentation

## 2021-08-24 DIAGNOSIS — Z13220 Encounter for screening for lipoid disorders: Secondary | ICD-10-CM | POA: Insufficient documentation

## 2021-08-24 DIAGNOSIS — E782 Mixed hyperlipidemia: Secondary | ICD-10-CM

## 2021-08-24 LAB — COMPREHENSIVE METABOLIC PANEL
ALT: 10 U/L (ref 0–35)
AST: 16 U/L (ref 0–37)
Albumin: 4.4 g/dL (ref 3.5–5.2)
Alkaline Phosphatase: 34 U/L — ABNORMAL LOW (ref 39–117)
BUN: 11 mg/dL (ref 6–23)
CO2: 30 mEq/L (ref 19–32)
Calcium: 9.2 mg/dL (ref 8.4–10.5)
Chloride: 101 mEq/L (ref 96–112)
Creatinine, Ser: 0.75 mg/dL (ref 0.40–1.20)
GFR: 93.95 mL/min (ref >60.00)
Glucose, Bld: 84 mg/dL (ref 70–99)
Potassium: 4.3 mEq/L (ref 3.5–5.1)
Sodium: 136 mEq/L (ref 135–145)
Total Bilirubin: 0.7 mg/dL (ref 0.2–1.2)
Total Protein: 7 g/dL (ref 6.0–8.3)

## 2021-08-24 LAB — CBC WITH DIFFERENTIAL/PLATELET
Basophils Absolute: 0 10*3/uL (ref 0.0–0.1)
Basophils Relative: 0.6 % (ref 0.0–3.0)
Eosinophils Absolute: 0.1 10*3/uL (ref 0.0–0.7)
Eosinophils Relative: 2.2 % (ref 0.0–5.0)
HCT: 41 % (ref 36.0–46.0)
Hemoglobin: 13.9 g/dL (ref 12.0–15.0)
Lymphocytes Relative: 33.6 % (ref 12.0–46.0)
Lymphs Abs: 1.4 10*3/uL (ref 0.7–4.0)
MCHC: 34 g/dL (ref 30.0–36.0)
MCV: 94.2 fl (ref 78.0–100.0)
Monocytes Absolute: 0.4 10*3/uL (ref 0.1–1.0)
Monocytes Relative: 10.7 % (ref 3.0–12.0)
Neutro Abs: 2.2 10*3/uL (ref 1.4–7.7)
Neutrophils Relative %: 52.9 % (ref 43.0–77.0)
Platelets: 196 10*3/uL (ref 150.0–400.0)
RBC: 4.35 Mil/uL (ref 3.87–5.11)
RDW: 12.7 % (ref 11.5–15.5)
WBC: 4.2 10*3/uL (ref 4.0–10.5)

## 2021-08-24 LAB — LIPID PANEL
Cholesterol: 189 mg/dL (ref 0–200)
HDL: 84.9 mg/dL (ref >39.00)
LDL Cholesterol: 93 mg/dL (ref 0–99)
NonHDL: 104.31
Total CHOL/HDL Ratio: 2
Triglycerides: 59 mg/dL (ref 0.0–149.0)
VLDL: 11.8 mg/dL (ref 0.0–40.0)

## 2021-08-24 MED ORDER — NA SULFATE-K SULFATE-MG SULF 17.5-3.13-1.6 GM/177ML PO SOLN: 1.0000 | Freq: Once | ORAL | 0 refills | Status: AC

## 2021-08-24 MED ORDER — CYCLOBENZAPRINE HCL 10 MG PO TABS: 10.0000 mg | ORAL_TABLET | Freq: Three times a day (TID) | ORAL | 0 refills | Status: DC | PRN

## 2021-08-24 MED ORDER — HYDROCORTISONE (PERIANAL) 2.5 % EX CREA: 1.0000 "application " | TOPICAL_CREAM | Freq: Two times a day (BID) | CUTANEOUS | 0 refills | Status: DC

## 2021-08-24 NOTE — Assessment & Plan Note (Signed)
Ordered lipid panel, pending results. Work on low cholesterol diet and exercise as tolerated ? ?

## 2021-08-24 NOTE — Assessment & Plan Note (Signed)
Neck xray today ?Neck exercises given to pt ?Flexeril 10 mg prn rx sent ?Ibuprofen 200 mg-600 mg take prn ?Warm heat to site/alternating ice if acute flare ?Consider physical therapy if needed ?

## 2021-08-24 NOTE — Assessment & Plan Note (Signed)
colonscopy ordered, referral to gi. pending ?

## 2021-08-24 NOTE — Patient Instructions (Addendum)
Call [facility] [facility] to schedule your mammogram as I have sent the electronic order to their facility.  ?Here is their number : [phone]  ? ?A referral was placed today for Gi to set up your colonoscopy.  ?Please let us know if you have not heard back within 1 week about your referral. ? ?Stop by the lab prior to leaving today. I will notify you of your results once received.  ? ?Complete xray(s) prior to leaving today. I will notify you of your results once received. ? ?It was a pleasure seeing you today! Please do not hesitate to reach out with any questions and or concerns. ? ?Regards,  ? ?Marysa Wessner ?FNP-C ? ? ?

## 2021-08-24 NOTE — Progress Notes (Signed)
Gastroenterology Pre-Procedure Review ? ?Request Date: 11/05/2021 ?Requesting Physician: Dr. Marius Ditch ? ?PATIENT REVIEW QUESTIONS: The patient responded to the following health history questions as indicated:   ? ?1. Are you having any GI issues? no ?2. Do you have a personal history of Polyps? no ?3. Do you have a family history of Colon Cancer or Polyps? no ?4. Diabetes Mellitus? no ?5. Joint replacements in the past 12 months?no ?6. Major health problems in the past 3 months?no ?7. Any artificial heart valves, MVP, or defibrillator?no ?   ?MEDICATIONS & ALLERGIES:    ?Patient reports the following regarding taking any anticoagulation/antiplatelet therapy:   ?Plavix, Coumadin, Eliquis, Xarelto, Lovenox, Pradaxa, Brilinta, or Effient? no ?Aspirin? no ? ?Patient confirms/reports the following medications:  ?Current Outpatient Medications  ?Medication Sig Dispense Refill  ? cyclobenzaprine (FLEXERIL) 10 MG tablet Take 1 tablet (10 mg total) by mouth 3 (three) times daily as needed for muscle spasms. 30 tablet 0  ? hydrocortisone (ANUSOL-HC) 2.5 % rectal cream Place 1 application. rectally 2 (two) times daily. 30 g 0  ? Multiple Vitamins-Minerals (MULTIVITAMIN ADULT PO) Take 1 capsule by mouth daily.    ? triamcinolone cream (KENALOG) 0.1 % Apply 1 application topically 2 (two) times daily. 30 g 0  ? ?No current facility-administered medications for this visit.  ? ? ?Patient confirms/reports the following allergies:  ?Allergies  ?Allergen Reactions  ? Naproxen Sodium [Naproxen] Other (See Comments)  ?  indigestion ?  ? ? ?No orders of the defined types were placed in this encounter. ? ? ?AUTHORIZATION INFORMATION ?Primary Insurance: ?1D#: ?Group #: ? ?Secondary Insurance: ?1D#: ?Group #: ? ?SCHEDULE INFORMATION: ?Date: 11/05/2021 ?Time: ?[place] ? ?

## 2021-08-24 NOTE — Assessment & Plan Note (Signed)

## 2021-08-24 NOTE — Assessment & Plan Note (Signed)
Mammogram ordered, screening ?Pending results ?

## 2021-08-24 NOTE — Assessment & Plan Note (Signed)
rx flexeril 10 mg prn  ? ?
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
## 2021-08-24 NOTE — Assessment & Plan Note (Signed)
rx anusol HC 2.5 % cream use prn  ?Hemorrhoid handout given ?Warm soaks when occur/sitz baths ?Avoid straining, avoid constipation ?

## 2021-09-03 ENCOUNTER — Encounter: Payer: Self-pay | Admitting: Family

## 2021-09-03 ENCOUNTER — Ambulatory Visit (INDEPENDENT_AMBULATORY_CARE_PROVIDER_SITE_OTHER): Payer: BC Managed Care – PPO | Admitting: Family

## 2021-09-03 ENCOUNTER — Other Ambulatory Visit (HOSPITAL_COMMUNITY)
Admission: RE | Admit: 2021-09-03 | Discharge: 2021-09-03 | Disposition: A | Discharge status: Home / Self Care | Payer: BC Managed Care – PPO | Source: Ambulatory Visit | Attending: Family | Admitting: Family

## 2021-09-03 VITALS — BP 98/70 | HR 60 | Temp 98.7°F | Resp 16 | Ht 62.0 in | Wt 134.2 lb

## 2021-09-03 DIAGNOSIS — N898 Other specified noninflammatory disorders of vagina: Secondary | ICD-10-CM | POA: Insufficient documentation

## 2021-09-03 DIAGNOSIS — Z1211 Encounter for screening for malignant neoplasm of colon: Secondary | ICD-10-CM | POA: Diagnosis not present

## 2021-09-03 DIAGNOSIS — Z1231 Encounter for screening mammogram for malignant neoplasm of breast: Secondary | ICD-10-CM

## 2021-09-03 DIAGNOSIS — Z124 Encounter for screening for malignant neoplasm of cervix: Secondary | ICD-10-CM | POA: Diagnosis present

## 2021-09-03 DIAGNOSIS — Z01411 Encounter for gynecological examination (general) (routine) with abnormal findings: Secondary | ICD-10-CM | POA: Insufficient documentation

## 2021-09-03 NOTE — Assessment & Plan Note (Signed)
Thin prep ordered pending results ?Wet prep ordered for vaginal discharge ?Pt tolerated procedure well  ?Verbal consent obtained for pelvic exam ?Pt advised on safe sex, wearing seatbelts in car, and proper nutrition ? ? ?

## 2021-09-03 NOTE — Progress Notes (Signed)
? ?Established Patient Office Visit ? ?Subjective:  ?Patient ID: Tonya Ruiz, female    DOB: 1971/01/17  Age: 51 y.o. MRN: [id number] ? ?CC:  ?Chief Complaint  ?Patient presents with  ? Gynecologic Exam  ? ? ?HPI ?Tonya Ruiz is here today for an well woman exam.  ? ?Pt is without acute concerns.  ? ?Mammogram: ordered, pt yet to schedule.  ?Last pap: 11/13/2015, negative.  ?Colonoscopy: scheduled in June 2023, this will be her first.  ? ?No vaginal discharge, no pain during sex. Sexually active, married. LMP march 30th, still pretty regular, however sometimes will change from heavy to medium/light flow. Otherwise frequency stable.  ? ?Hemorrhoids: anusol has been helpful, doing well.  ? ?Past Medical History:  ?Diagnosis Date  ? Allergy   ? Arthritis   ? Chicken pox   ? Positive TB test   ? ? ?Past Surgical History:  ?Procedure Laterality Date  ? wisdom teeth    ? WRIST GANGLION EXCISION Left 2000  ? ? ?Family History  ?Problem Relation Age of Onset  ? Diabetes type II Mother   ? Arthritis Father   ? Cancer Father   ?     Lung cancer, in remission  ? Arthritis Paternal Grandmother   ? Cancer Paternal Grandmother   ?     lung cancer  ? Diabetes Paternal Grandmother   ? Celiac disease Paternal Grandmother   ? Heart attack Paternal Grandmother   ?     self induced/ accidental overdose  ? ? ?Social History  ? ?Socioeconomic History  ? Marital status: Married  ?  Spouse name: Not on file  ? Number of children: 1  ? Years of education: Not on file  ? Highest education level: Not on file  ?Occupational History  ? 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 Smoking status: Former  ?  Packs/day: 1.00  ?  Years: 12.00  ?  Pack years: 12.00  ?  Types: Cigarettes  ?  Quit date: 2010  ?  Years since quitting: 13.2  ? Smokeless tobacco: Never  ? Tobacco comments:  ?  quit 8 years ago  ?Vaping Use  ? Vaping Use: Never used  ?Substance and Sexual Activity  ? Alcohol use: Not Currently  ?  Comment: very rarely   ? Drug use: No  ? Sexual activity: Yes  ?  Partners: Male  ?  Comment: husband vasectomy  ?Other Topics Concern  ? Not on file  ?Social History Narrative  ? Boy - 51 y/o   ? ?Social Determinants of Health  ? ?Financial Resource Strain: Not on file  ?Food Insecurity: Not on file  ?Transportation Needs: Not on file  ?Physical Activity: Not on file  ?Stress: Not on file  ?Social Connections: Not on file  ?Intimate Partner Violence: Not on file  ? ? ?Outpatient Medications Prior to Visit  ?Medication Sig Dispense Refill  ? cyclobenzaprine (FLEXERIL) 10 MG tablet Take 1 tablet (10 mg total) by mouth 3 (three) times daily as needed for muscle spasms. 30 tablet 0  ? hydrocortisone (ANUSOL-HC) 2.5 % rectal cream Place 1 application. rectally 2 (two) times daily. 30 g 0  ? Multiple Vitamins-Minerals (MULTIVITAMIN ADULT PO) Take 1 capsule by mouth daily.    ? triamcinolone cream (KENALOG) 0.1 % Apply 1 application topically 2 (two) times daily. 30 g 0  ? ?No facility-administered medications prior to visit.  ? ? ?Allergies  ?Allergen Reactions  ?  Naproxen Sodium [Naproxen] Other (See Comments)  ?  indigestion ?  ? ? ?ROS ?Review of Systems ? ?Review of Systems   ?Breasts: negative for nipple discharge, breast pain, breast mass, nipple changes, breast rash. ?Genitourinary:  Negative for decreased urine volume, difficulty urinating, dyspareunia, dysuria, flank pain, menstrual problem, pelvic pain, urgency, vaginal bleeding, vaginal discharge and vaginal pain.  ?Psychiatric/Behavioral:  Negative for depression and suicidal ideas.   ?All other systems reviewed and are negative. ? ?  ?Objective:  ?  ?Physical Exam ? ?Gen: NAD, resting comfortably ?Breasts: breasts appear normal, no suspicious masses, no skin or nipple changes or axillary nodes ?Physical Exam ?Genitourinary: ?   General: Normal vulva.  ?   Pubic Area: No rash.   ?   Labia:    ?   Right: No rash, tenderness, lesion or injury.     ?   Left: No rash, tenderness,  lesion or injury.   ?   Urethra: No prolapse or urethral pain.  ?   Vagina: Normal. No vaginal discharge, tenderness or lesions.  ?   Cervix: white thick vaginal discharge, cervix is retroverted. A lot of discharge, hard to clear for cervical os, no erythema  ?   Rectum: Normal.  ?Psych: Normal affect and thought content ? ?BP 98/70   Pulse 60   Temp 98.7 ?F (37.1 ?C)   Resp 16   Ht 5' 2"  (1.575 m)   Wt 134 lb 4 oz (60.9 kg)   LMP 08/23/2021   SpO2 98%   BMI 24.55 kg/m?  ?Wt Readings from Last 3 Encounters:  ?09/03/21 134 lb 4 oz (60.9 kg)  ?08/24/21 136 lb 4 oz (61.8 kg)  ?07/24/21 139 lb (63 kg)  ? ? ? ?Health Maintenance Due  ?Topic Date Due  ? COLONOSCOPY (Pts 45-44yr Insurance coverage will need to be confirmed)  Never done  ? PAP SMEAR-Modifier  11/13/2018  ? ? ?There are no preventive care reminders to display for this patient. ? ?No results found for: TSH ?Lab Results  ?Component Value Date  ? WBC 4.2 08/24/2021  ? HGB 13.9 08/24/2021  ? HCT 41.0 08/24/2021  ? MCV 94.2 08/24/2021  ? PLT 196.0 08/24/2021  ? ?Lab Results  ?Component Value Date  ? NA 136 08/24/2021  ? K 4.3 08/24/2021  ? CO2 30 08/24/2021  ? GLUCOSE 84 08/24/2021  ? BUN 11 08/24/2021  ? CREATININE 0.75 08/24/2021  ? BILITOT 0.7 08/24/2021  ? ALKPHOS 34 (L) 08/24/2021  ? AST 16 08/24/2021  ? ALT 10 08/24/2021  ? PROT 7.0 08/24/2021  ? ALBUMIN 4.4 08/24/2021  ? CALCIUM 9.2 08/24/2021  ? GFR 93.95 08/24/2021  ? ?Lab Results  ?Component Value Date  ? CHOL 189 08/24/2021  ? ?Lab Results  ?Component Value Date  ? HDL 84.90 08/24/2021  ? ?Lab Results  ?Component Value Date  ? [place] 08/24/2021  ? ?Lab Results  ?Component Value Date  ? TRIG 59.0 08/24/2021  ? ?Lab Results  ?Component Value Date  ? CHOLHDL 2 08/24/2021  ? ?No results found for: HGBA1C ? ?  ?Assessment & Plan:  ? ?Problem List Items Addressed This Visit   ? ?  ? Other  ? Screening mammogram for breast cancer  ?  Ordered last visit, pt to schedule. Information given to pt  with phone number. ?  ?  ? Screening for colon cancer  ?  Pt to go as scheduled in June.  ?  ?  ?  Screening for cervical cancer - Primary  ? Relevant Orders  ? Cytology - PAP  ? Encounter for gynecological examination (general) (routine) with abnormal findings  ?  Thin prep ordered pending results ?Wet prep ordered for vaginal discharge ?Pt tolerated procedure well  ?Verbal consent obtained for pelvic exam ?Pt advised on safe sex, wearing seatbelts in car, and proper nutrition ? ? ?  ?  ? Vaginal discharge  ?  Wet prep ordered pending results ?Pt declines std testing ?  ?  ? Relevant Orders  ? WET PREP BY MOLECULAR PROBE  ? ? ?No orders of the defined types were placed in this encounter. ? ? ?Follow-up: Return in about 6 months (around 03/05/2022) for regular follow up appt.  ? ? ?Eugenia Pancoast, FNP ?

## 2021-09-03 NOTE — Assessment & Plan Note (Signed)
Pt to go as scheduled in June.  ?

## 2021-09-03 NOTE — Assessment & Plan Note (Signed)
Ordered last visit, pt to schedule. Information given to pt with phone number. ?

## 2021-09-03 NOTE — Assessment & Plan Note (Signed)
Wet prep ordered pending results ?Pt declines std testing ?

## 2021-09-03 NOTE — Patient Instructions (Signed)
Call [facility] [facility] to schedule your mammogram as I have sent the electronic order to their facility.  ?Here is their number : [phone]  ? ?It was a pleasure seeing you today! Please do not hesitate to reach out with any questions and or concerns. ? ?Regards,  ? ?Margaux Engen ?FNP-C ? ? ?

## 2021-09-04 LAB — WET PREP BY MOLECULAR PROBE
Candida species: NOT DETECTED
Gardnerella vaginalis: NOT DETECTED
MICRO NUMBER:: 13242194
SPECIMEN QUALITY:: ADEQUATE
Trichomonas vaginosis: NOT DETECTED

## 2021-09-04 LAB — CYTOLOGY - PAP
Adequacy: ABSENT
Comment: NEGATIVE
Diagnosis: NEGATIVE
High risk HPV: NEGATIVE

## 2021-09-14 ENCOUNTER — Telehealth: Payer: Self-pay

## 2021-09-14 NOTE — Telephone Encounter (Signed)
Called patient reviewed all information and repeated back to me. Will call if any questions.  ? ?

## 2021-11-20 ENCOUNTER — Encounter: Payer: Self-pay | Admitting: Gastroenterology

## 2021-11-21 ENCOUNTER — Other Ambulatory Visit: Payer: Self-pay

## 2021-11-21 ENCOUNTER — Ambulatory Visit: Payer: BC Managed Care – PPO | Admitting: Certified Registered"

## 2021-11-21 ENCOUNTER — Ambulatory Visit
Admission: RE | Admit: 2021-11-21 | Discharge: 2021-11-21 | Disposition: A | Discharge status: Home / Self Care | Payer: BC Managed Care – PPO | Source: Ambulatory Visit | Attending: Gastroenterology | Admitting: Gastroenterology

## 2021-11-21 ENCOUNTER — Encounter: Payer: Self-pay | Admitting: Gastroenterology

## 2021-11-21 ENCOUNTER — Encounter
Admission: RE | Disposition: A | Discharge status: Home / Self Care | Payer: Self-pay | Source: Ambulatory Visit | Attending: Gastroenterology

## 2021-11-21 DIAGNOSIS — D127 Benign neoplasm of rectosigmoid junction: Secondary | ICD-10-CM | POA: Insufficient documentation

## 2021-11-21 DIAGNOSIS — Z1211 Encounter for screening for malignant neoplasm of colon: Secondary | ICD-10-CM | POA: Insufficient documentation

## 2021-11-21 DIAGNOSIS — Z87891 Personal history of nicotine dependence: Secondary | ICD-10-CM | POA: Diagnosis not present

## 2021-11-21 DIAGNOSIS — K635 Polyp of colon: Secondary | ICD-10-CM | POA: Diagnosis not present

## 2021-11-21 HISTORY — PX: COLONOSCOPY WITH PROPOFOL: SHX5780

## 2021-11-21 LAB — POCT PREGNANCY, URINE: Preg Test, Ur: NEGATIVE

## 2021-11-21 SURGERY — COLONOSCOPY WITH PROPOFOL
Anesthesia: General

## 2021-11-21 MED ORDER — GLYCOPYRROLATE 0.2 MG/ML IJ SOLN
INTRAMUSCULAR | Status: AC
  Filled 2021-11-21: qty 1

## 2021-11-21 MED ORDER — PROPOFOL 10 MG/ML IV BOLUS
INTRAVENOUS | Status: DC | PRN
  Administered 2021-11-21: 30 mg via INTRAVENOUS
  Administered 2021-11-21 (×4): 40 mg via INTRAVENOUS
  Administered 2021-11-21: 100 mg via INTRAVENOUS

## 2021-11-21 MED ORDER — LIDOCAINE HCL (PF) 2 % IJ SOLN
INTRAMUSCULAR | Status: AC
  Filled 2021-11-21: qty 15

## 2021-11-21 MED ORDER — SODIUM CHLORIDE 0.9 % IV SOLN: INTRAVENOUS | Status: DC

## 2021-11-21 MED ORDER — PROPOFOL 1000 MG/100ML IV EMUL
INTRAVENOUS | Status: AC
  Filled 2021-11-21: qty 300

## 2021-11-21 NOTE — Transfer of Care (Signed)
Immediate Anesthesia Transfer of Care Note  Patient: Tonya Ruiz  Procedure(s) Performed: COLONOSCOPY WITH PROPOFOL  Patient Location: Endoscopy Unit  Anesthesia Type:General  Level of Consciousness: awake, alert  and oriented  Airway & Oxygen Therapy: Patient Spontanous Breathing and Patient connected to nasal cannula oxygen  Post-op Assessment: Report given to RN, Post -op Vital signs reviewed and stable and Patient moving all extremities  Post vital signs: Reviewed and stable  Last Vitals:  Vitals Value Taken Time  BP    Temp    Pulse    Resp    SpO2      Last Pain:  Vitals:   11/21/21 0734  TempSrc: Temporal  PainSc: 0-No pain         Complications: No notable events documented.

## 2021-11-21 NOTE — Op Note (Signed)
[facility] Patient Name: Tonya Ruiz Procedure Date: 11/21/2021 6:55 AM MRN: [id number] Account #: [id number] Date of Birth: [birth year] Admit Type: Outpatient Age: 51 Room: [facility] ENDO ROOM 4 Gender: Female Note Status: Finalized Instrument Name: Park Meo [id number] Procedure:             Colonoscopy Indications:           Screening for colorectal malignant neoplasm, This is                         the patient's first colonoscopy Providers:             Lin Landsman MD, MD Referring MD:          Eugenia Pancoast (Referring MD) Medicines:             General Anesthesia Complications:         No immediate complications. Estimated blood loss: None. Procedure:             Pre-Anesthesia Assessment:                        - Prior to the procedure, a History and Physical was                         performed, and patient medications and allergies were                         reviewed. The patient is competent. The risks and                         benefits of the procedure and the sedation options and                         risks were discussed with the patient. All questions                         were answered and informed consent was obtained.                         Patient identification and proposed procedure were                         verified by the physician, the nurse, the                         anesthesiologist, the anesthetist and the technician                         in the pre-procedure area in the procedure room in the                         endoscopy suite. Mental Status Examination: alert and                         oriented. Airway Examination: normal oropharyngeal                         airway and neck mobility. Respiratory Examination:  clear to auscultation. CV Examination: normal.                         Prophylactic Antibiotics: The patient does not require                         prophylactic  antibiotics. Prior Anticoagulants: The                         patient has taken no previous anticoagulant or                         antiplatelet agents. ASA Grade Assessment: II - A                         patient with mild systemic disease. After reviewing                         the risks and benefits, the patient was deemed in                         satisfactory condition to undergo the procedure. The                         anesthesia plan was to use general anesthesia.                         Immediately prior to administration of medications,                         the patient was re-assessed for adequacy to receive                         sedatives. The heart rate, respiratory rate, oxygen                         saturations, blood pressure, adequacy of pulmonary                         ventilation, and response to care were monitored                         throughout the procedure. The physical status of the                         patient was re-assessed after the procedure.                        After obtaining informed consent, the colonoscope was                         passed under direct vision. Throughout the procedure,                         the patient's blood pressure, pulse, and oxygen                         saturations were monitored continuously. The  Colonoscope was introduced through the anus and                         advanced to the the terminal ileum, with                         identification of the appendiceal orifice and IC                         valve. The colonoscopy was performed without                         difficulty. The patient tolerated the procedure well.                         The quality of the bowel preparation was evaluated                         using the BBPS [facility] Bowel Preparation Scale) with                         scores of: Right Colon = 3, Transverse Colon = 3 and                         Left Colon =  3 (entire mucosa seen well with no                         residual staining, small fragments of stool or opaque                         liquid). The total BBPS score equals 9. Findings:      The perianal and digital rectal examinations were normal. Pertinent       negatives include normal sphincter tone and no palpable rectal lesions.      The terminal ileum appeared normal.      A diminutive polyp was found in the recto-sigmoid colon. The polyp was       sessile. The polyp was removed with a jumbo cold forceps. Resection and       retrieval were complete.      The retroflexed view of the distal rectum and anal verge was normal and       showed no anal or rectal abnormalities. Impression:            - The examined portion of the ileum was normal.                        - One diminutive polyp at the recto-sigmoid colon,                         removed with a jumbo cold forceps. Resected and                         retrieved.                        - The distal rectum and anal verge are normal on  retroflexion view. Recommendation:        - Discharge patient to home (with escort).                        - Resume regular diet today.                        - Continue present medications.                        - Await pathology results.                        - Repeat colonoscopy in 7-10 years for surveillance                         based on pathology results. Procedure Code(s):     --- Professional ---                        [phone], Colonoscopy, flexible; with biopsy, single or                         multiple Diagnosis Code(s):     --- Professional ---                        Z12.11, Encounter for screening for malignant neoplasm                         of colon                        K63.5, Polyp of colon CPT copyright 2019 [employer]. All rights reserved. The codes documented in this report are preliminary and upon coder review may  be revised  to meet current compliance requirements. Dr. Ulyess Mort Lin Landsman MD, MD 11/21/2021 8:29:04 AM This report has been signed electronically. Number of Addenda: 0 Note Initiated On: 11/21/2021 6:55 AM Scope Withdrawal Time: 0 hours 9 minutes 17 seconds  Total Procedure Duration: 0 hours 13 minutes 16 seconds  Estimated Blood Loss:  Estimated blood loss: none.      [facility]

## 2021-11-21 NOTE — H&P (Signed)
Cephas Darby, MD [address]  [place]  [place], [place] [address]  Main: [phone]  Fax: [phone] Pager: [phone]  Primary Care Physician:  Eugenia Pancoast, FNP Primary Gastroenterologist:  Dr. Cephas Darby  Pre-Procedure History & Physical: HPI:  Tonya Ruiz is a 51 y.o. female is here for an colonoscopy.   Past Medical History:  Diagnosis Date   Allergy    Arthritis    Chicken pox    Positive TB test     Past Surgical History:  Procedure Laterality Date   wisdom teeth     WRIST GANGLION EXCISION Left 2000    Prior to Admission medications   Medication Sig Start Date End Date Taking? Authorizing Provider  Multiple Vitamins-Minerals (MULTIVITAMIN ADULT PO) Take 1 capsule by mouth daily.   Yes [provider]  cyclobenzaprine (FLEXERIL) 10 MG tablet Take 1 tablet (10 mg total) by mouth 3 (three) times daily as needed for muscle spasms. 08/24/21   Eugenia Pancoast, FNP  hydrocortisone (ANUSOL-HC) 2.5 % rectal cream Place 1 application. rectally 2 (two) times daily. 08/24/21   Eugenia Pancoast, FNP  triamcinolone cream (KENALOG) 0.1 % Apply 1 application topically 2 (two) times daily. Patient not taking: Reported on 11/21/2021 07/24/21   Eugenia Pancoast, FNP    Allergies as of 08/24/2021 - Review Complete 08/24/2021  Allergen Reaction Noted   Naproxen sodium [naproxen] Other (See Comments) 09/20/2014    Family History  Problem Relation Age of Onset   Diabetes type II Mother    Arthritis Father    Cancer Father        Lung cancer, in remission   Arthritis Paternal Grandmother    Cancer Paternal Grandmother        lung cancer   Diabetes Paternal Grandmother    Celiac disease Paternal Grandmother    Heart attack Paternal Grandmother        self induced/ accidental overdose    Social History   Socioeconomic History   Marital status: Married    Spouse name: Not on file   Number of children: 1   Years of education: Not on file   Highest  education level: Not on file  Occupational History   Occupation: Pharmacist, hospital    Comment: elementary, 2nd  Tobacco Use   Smoking status: Former    Packs/day: 1.00    Years: 12.00    Total pack years: 12.00    Types: Cigarettes    Quit date: 2010    Years since quitting: 13.4   Smokeless tobacco: Never   Tobacco comments:    quit 8 years ago  Vaping Use   Vaping Use: Never used  Substance and Sexual Activity   Alcohol use: Not Currently    Comment: very rarely   Drug use: No   Sexual activity: Yes    Partners: Male    Comment: husband vasectomy  Other Topics Concern   Not on file  Social History Narrative   Boy - 51 y/o    Social Determinants of Health   Financial Resource Strain: Not on file  Food Insecurity: Not on file  Transportation Needs: Not on file  Physical Activity: Not on file  Stress: Not on file  Social Connections: Not on file  Intimate Partner Violence: Not on file    Review of Systems: See HPI, otherwise negative ROS  Physical Exam: BP 110/74   Pulse 74   Temp 97.8 F (36.6 C) (Temporal)   Resp 16   Ht  5' 2"  (1.575 m)   Wt 59 kg   SpO2 100%   BMI 23.78 kg/m  General:   Alert,  pleasant and cooperative in NAD Head:  Normocephalic and atraumatic. Neck:  Supple; no masses or thyromegaly. Lungs:  Clear throughout to auscultation.    Heart:  Regular rate and rhythm. Abdomen:  Soft, nontender and nondistended. Normal bowel sounds, without guarding, and without rebound.   Neurologic:  Alert and  oriented x4;  grossly normal neurologically.  Impression/Plan: Tonya Ruiz is here for an colonoscopy to be performed for colon cancer screening  Risks, benefits, limitations, and alternatives regarding  colonoscopy have been reviewed with the patient.  Questions have been answered.  All parties agreeable.   Sherri Sear, MD  11/21/2021, 8:03 AM

## 2021-11-21 NOTE — Anesthesia Preprocedure Evaluation (Signed)
Anesthesia Evaluation  Patient identified by MRN, date of birth, ID band Patient awake    Reviewed: Allergy & Precautions, H&P , NPO status , Patient's Chart, lab work & pertinent test results, reviewed documented beta blocker date and time   History of Anesthesia Complications Negative for: history of anesthetic complications  Airway Mallampati: I  TM Distance: >3 FB Neck ROM: full    Dental  (+) Dental Advidsory Given, Caps, Teeth Intact, Missing   Pulmonary neg pulmonary ROS, former smoker,    Pulmonary exam normal breath sounds clear to auscultation       Cardiovascular Exercise Tolerance: Good negative cardio ROS Normal cardiovascular exam Rhythm:regular Rate:Normal     Neuro/Psych negative neurological ROS  negative psych ROS   GI/Hepatic negative GI ROS, Neg liver ROS,   Endo/Other  negative endocrine ROS  Renal/GU negative Renal ROS  negative genitourinary   Musculoskeletal   Abdominal   Peds  Hematology negative hematology ROS (+)   Anesthesia Other Findings Past Medical History: No date: Allergy No date: Arthritis No date: Chicken pox 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   Reproductive/Obstetrics negative OB ROS                             Anesthesia Physical Anesthesia Plan  ASA: 1  Anesthesia Plan: General   Post-op Pain Management:    Induction: Intravenous  PONV Risk Score and Plan: 3 and Propofol infusion and TIVA  Airway Management Planned: Natural Airway and Nasal Cannula  Additional Equipment:   Intra-op Plan:   Post-operative Plan:   Informed Consent: I have reviewed the patients History and Physical, chart, labs and discussed the procedure including the risks, benefits and alternatives for the proposed anesthesia with the patient or authorized representative who has indicated his/her understanding and acceptance.     Dental Advisory Given  Plan Discussed  with: Anesthesiologist, CRNA and Surgeon  Anesthesia Plan Comments:         Anesthesia Quick Evaluation

## 2021-11-22 ENCOUNTER — Encounter: Payer: Self-pay | Admitting: Gastroenterology

## 2021-11-22 LAB — SURGICAL PATHOLOGY

## 2021-11-27 NOTE — Anesthesia Postprocedure Evaluation (Signed)
Anesthesia Post Note  Patient: Tonya Ruiz  Procedure(s) Performed: COLONOSCOPY WITH PROPOFOL  Patient location during evaluation: Endoscopy Anesthesia Type: General Level of consciousness: awake and alert Pain management: pain level controlled Vital Signs Assessment: post-procedure vital signs reviewed and stable Respiratory status: spontaneous breathing, nonlabored ventilation, respiratory function stable and patient connected to nasal cannula oxygen Cardiovascular status: blood pressure returned to baseline and stable Postop Assessment: no apparent nausea or vomiting Anesthetic complications: no   No notable events documented.   Last Vitals:  Vitals:   11/21/21 0839 11/21/21 0849  BP: 101/75 109/65  Pulse: 87 72  Resp: 16 12  Temp:    SpO2: 100% 100%    Last Pain:  Vitals:   11/21/21 0849  TempSrc:   PainSc: 0-No pain                 Martha Clan

## 2021-12-24 ENCOUNTER — Ambulatory Visit: Payer: BC Managed Care – PPO | Admitting: Dermatology

## 2021-12-24 DIAGNOSIS — L708 Other acne: Secondary | ICD-10-CM

## 2021-12-24 DIAGNOSIS — L738 Other specified follicular disorders: Secondary | ICD-10-CM | POA: Diagnosis not present

## 2021-12-24 DIAGNOSIS — I8393 Asymptomatic varicose veins of bilateral lower extremities: Secondary | ICD-10-CM

## 2021-12-24 DIAGNOSIS — L821 Other seborrheic keratosis: Secondary | ICD-10-CM

## 2021-12-24 DIAGNOSIS — L539 Erythematous condition, unspecified: Secondary | ICD-10-CM

## 2021-12-24 DIAGNOSIS — L905 Scar conditions and fibrosis of skin: Secondary | ICD-10-CM | POA: Diagnosis not present

## 2021-12-24 DIAGNOSIS — L578 Other skin changes due to chronic exposure to nonionizing radiation: Secondary | ICD-10-CM

## 2021-12-24 MED ORDER — MOMETASONE FUROATE 0.1 % EX CREA: 1.0000 | TOPICAL_CREAM | CUTANEOUS | 0 refills | Status: DC

## 2021-12-24 NOTE — Patient Instructions (Signed)
Discussed sclerotherapy for spider vein treatment.  Discussed that it is a cosmetic procedure and is not covered by insurance ($350/treatment).  Treatment consists of injecting the spider veins with a medicine called Asclera to help them disappear.  Multiple treatments are generally necessary to get best results.  Risks including bruising and persistent discoloration due to post-inflammatory hyperpigmentation.  Sclerotherapy does not prevent the development of new spider veins.  Daily compression hose for two weeks after procedure is recommended.    Due to recent changes in healthcare laws, you may see results of your pathology and/or laboratory studies on MyChart before the doctors have had a chance to review them. We understand that in some cases there may be results that are confusing or concerning to you. Please understand that not all results are received at the same time and often the doctors may need to interpret multiple results in order to provide you with the best plan of care or course of treatment. Therefore, we ask that you please give [place] 2 business days to thoroughly review all your results before contacting the office for clarification. Should we see a critical lab result, you will be contacted sooner.   If You Need Anything After Your Visit  If you have any questions or concerns for your doctor, please call our main line at [phone] and press option 4 to reach your doctor's medical assistant. If no one answers, please leave a voicemail as directed and we will return your call as soon as possible. Messages left after 4 pm will be answered the following business day.   You may also send [place] a message via [place]. We typically respond to MyChart messages within 1-2 business days.  For prescription refills, please ask your pharmacy to contact our office. Our fax number is [phone].  If you have an urgent issue when the clinic is closed that cannot wait until the next business day,  you can page your doctor at the number below.    Please note that while we do our best to be available for urgent issues outside of office hours, we are not available 24/7.   If you have an urgent issue and are unable to reach [place], you may choose to seek medical care at your doctor's office, retail clinic, urgent care center, or emergency room.  If you have a medical emergency, please immediately call 911 or go to the emergency department.  Pager Numbers  - Dr. Nehemiah Massed: [phone]  - Dr. Laurence Ferrari: [phone]  - Dr. Nicole Kindred: [phone]  In the event of inclement weather, please call our main line at [phone] for an update on the status of any delays or closures.  Dermatology Medication Tips: Please keep the boxes that topical medications come in in order to help keep track of the instructions about where and how to use these. Pharmacies typically print the medication instructions only on the boxes and not directly on the medication tubes.   If your medication is too expensive, please contact our office at [phone] option 4 or send [place] a message through [place].   We are unable to tell what your co-pay for medications will be in advance as this is different depending on your insurance coverage. However, we may be able to find a substitute medication at lower cost or fill out paperwork to get insurance to cover a needed medication.   If a prior authorization is required to get your medication covered by your insurance company, please allow [place] 1-2 business days  to complete this process.  Drug prices often vary depending on where the prescription is filled and some pharmacies may offer cheaper prices.  The website www.goodrx.com contains coupons for medications through different pharmacies. The prices here do not account for what the cost may be with help from insurance (it may be cheaper with your insurance), but the website can give you the price if you did not use any insurance.   - You can print the associated coupon and take it with your prescription to the pharmacy.  - You may also stop by our office during regular business hours and pick up a GoodRx coupon card.  - If you need your prescription sent electronically to a different pharmacy, notify our office through [facility] or by phone at [phone] option 4.     Si Usted Necesita Algo Despus de Su Visita  Tambin puede enviarnos un mensaje a travs de Pharmacist, community. Por lo general respondemos a los mensajes de MyChart en el transcurso de 1 a 2 das hbiles.  Para renovar recetas, por favor pida a su farmacia que se ponga en contacto con nuestra oficina. Harland Dingwall de fax es [place] [phone].  Si tiene un asunto urgente cuando la clnica est cerrada y que no puede esperar hasta el siguiente da hbil, puede llamar/localizar a su doctor(a) al nmero que aparece a continuacin.   Por favor, tenga en cuenta que aunque hacemos todo lo posible para estar disponibles para asuntos urgentes fuera del horario de [place], no estamos disponibles las 24 horas del da, los 7 das de la [place].   Si tiene un problema urgente y no puede comunicarse con nosotros, puede optar por buscar atencin mdica  en el consultorio de su doctor(a), en una clnica privada, en un centro de atencin urgente o en una sala de emergencias.  Si tiene Engineering geologist, por favor llame inmediatamente al 911 o vaya a la sala de emergencias.  Nmeros de bper  - Dr. Nehemiah Massed: [phone]  - Dra. Moye: [phone]  - Dra. Nicole Kindred: [phone]  En caso de inclemencias del [place], por favor llame a Johnsie Kindred principal al [phone] para una actualizacin sobre el [place] de cualquier retraso o cierre.  Consejos para la medicacin en dermatologa: Por favor, guarde las cajas en las que vienen los medicamentos de uso tpico para ayudarle a seguir las instrucciones sobre dnde y cmo usarlos. Las farmacias generalmente  imprimen las instrucciones del medicamento slo en las cajas y no directamente en los tubos del [place].   Si su medicamento es muy caro, por favor, pngase en contacto con Zigmund Daniel llamando al [phone] y presione la opcin 4 o envenos un mensaje a travs de Pharmacist, community.   No podemos decirle cul ser su copago por los medicamentos por adelantado ya que esto es diferente dependiendo de la cobertura de su seguro. Sin embargo, es posible que podamos encontrar un medicamento sustituto a Electrical engineer un formulario para que el seguro cubra el medicamento que se considera necesario.   Si se requiere una autorizacin previa para que su compaa de seguros [place] su medicamento, por favor permtanos de 1 a 2 das hbiles para completar este proceso.  Los precios de los medicamentos varan con frecuencia dependiendo del Environmental consultant de dnde se surte la receta y alguna farmacias pueden ofrecer precios ms baratos.  El sitio web www.goodrx.com tiene cupones para medicamentos de Airline pilot. Los precios aqu no tienen en cuenta lo que podra costar con la [employer]  del seguro (puede ser ms barato con su seguro), pero el sitio web puede darle el precio si no Field seismologist.  - Puede imprimir el cupn correspondiente y llevarlo con su receta a la farmacia.  - Tambin puede pasar por nuestra oficina durante el horario de atencin regular y Charity fundraiser una tarjeta de cupones de GoodRx.  - Si necesita que su receta se enve electrnicamente a una farmacia diferente, informe a nuestra oficina a travs de MyChart de [place] o por telfono llamando al [phone] y presione la opcin 4.

## 2021-12-24 NOTE — Progress Notes (Unsigned)
   New Patient Visit  Subjective  Tonya Ruiz is a 51 y.o. female who presents for the following: Other (New patient - bumps of shoulders x few years - maybe treated with [facility]?/Scar of left ankle from steel rope burn/Spot of left lower leg that scaly). The patient has spots, moles and lesions to be evaluated, some may be new or changing and the patient has concerns that these could be cancer.  The following portions of the chart were reviewed this encounter and updated as appropriate:   Tobacco  Allergies  Meds  Problems  Med Hx  Surg Hx  Fam Hx     Review of Systems:  No other skin or systemic complaints except as noted in HPI or Assessment and Plan.  Objective  Well appearing patient in no apparent distress; mood and affect are within normal limits.  A focused examination was performed including back, left leg. Relevant physical exam findings are noted in the Assessment and Plan.  Upper back Closed comedones  Left prox lat calf Stuck-on, waxy, tan-brown papule or plaque --Discussed benign etiology and prognosis.   Left Ankle - Anterior Scar  Legs Spider veins   Assessment & Plan  Acne with closed comedones and element of sebaceous hyperplasia Upper back Discussed treating with a topical retinoid - patient declines today because condition is not problematic at this time.  Seborrheic keratosis Left prox lat calf Benign-appearing.  Observation.  Call clinic for new or changing lesions.  Recommend daily use of broad spectrum spf 30+ sunscreen to sun-exposed areas.   Scar Left Ankle - Anterior Secondary to a friction burn from dog leash With persistent inflammation and erythema Start Mometasone cream qd-bid 5 times per week for 3 weeks Then discontinue. Advised there may be a persistent discoloration/postinflammatory pigment alteration that may fade over time.  There may be a permanent discoloration but should be much less significant than what appears  today. mometasone (ELOCON) 0.1 % cream - Left Ankle - Anterior Apply 1 Application topically as directed. Qd-bid 5 times per week for 3 weeks  Spider veins of both lower extremities Legs Discussed sclerotherapy for spider vein treatment.  Discussed that it is a cosmetic procedure and is not covered by insurance ($350/treatment).  Treatment consists of injecting the spider veins with a medicine called Asclera to help them disappear.  Multiple treatments are generally necessary to get best results.  Risks including bruising and persistent discoloration due to post-inflammatory hyperpigmentation.  Sclerotherapy does not prevent the development of new spider veins.  Daily compression hose for two weeks after procedure is recommended.   Actinic Damage - chronic, secondary to cumulative UV radiation exposure/sun exposure over time - diffuse scaly erythematous macules with underlying dyspigmentation - Recommend daily broad spectrum sunscreen SPF 30+ to sun-exposed areas, reapply every 2 hours as needed.  - Recommend staying in the shade or wearing long sleeves, sun glasses (UVA+UVB protection) and wide brim hats (4-inch brim around the entire circumference of the hat). - Call for new or changing lesions.  Return if symptoms worsen or fail to improve.  I, Ashok Cordia, CMA, am acting as scribe for Sarina Ser, MD . Documentation: I have reviewed the above documentation for accuracy and completeness, and I agree with the above.  Sarina Ser, MD

## 2021-12-25 ENCOUNTER — Encounter: Payer: Self-pay | Admitting: Dermatology

## 2022-01-17 ENCOUNTER — Ambulatory Visit: Payer: BC Managed Care – PPO | Admitting: Family Medicine

## 2022-01-17 ENCOUNTER — Encounter: Payer: Self-pay | Admitting: Family Medicine

## 2022-01-17 VITALS — BP 92/60 | HR 68 | Temp 99.9°F | Ht 62.0 in | Wt 139.0 lb

## 2022-01-17 DIAGNOSIS — M5030 Other cervical disc degeneration, unspecified cervical region: Secondary | ICD-10-CM

## 2022-01-17 DIAGNOSIS — M5412 Radiculopathy, cervical region: Secondary | ICD-10-CM | POA: Diagnosis not present

## 2022-01-17 MED ORDER — PREDNISONE 20 MG PO TABS: ORAL_TABLET | ORAL | 0 refills | Status: DC

## 2022-01-17 NOTE — Progress Notes (Signed)
Tonya Pietila T. Teliah Buffalo, MD, [place] at [facility] [place], [address]  Phone: [phone]  FAX: [phone]  Tonya Ruiz - 51 y.o. female  MRN [id number]  Date of Birth: 06-Apr-1971  Date: 01/17/2022  PCP: Eugenia Pancoast, FNP  Referral: Eugenia Pancoast, FNP  Chief Complaint  Patient presents with   Arm Numbness    Right   Subjective:   Tonya Ruiz is a 51 y.o. very pleasant female patient with Body mass index is 25.42 kg/m. who presents with the following:  She presents with right-sided hand pain, numbness and tingling.  Numbness and pain - mostly the elbow down.  Will wake up in the middle of the night, sometimes at day.  She does not really have any focal neck pain and she retains full range of motion at the neck.  She does occasionally have some shoulder blade pain, as well.  Sensation is variable, but at times it will be decreased.  There is some tingling associated.  Has been working a lot outside, but cannot pinpoint any specific activity that could have made it worse.   She is not able to induce additional symptoms from neck movement. No prior history of major neck problems, shoulder problems, fractures, or operative intervention.  No prior spinal intervention.  No recent manipulation.  Review of Systems is noted in the HPI, as appropriate  Objective:   BP 92/60   Pulse 68   Temp 99.9 F (37.7 C) (Oral)   Ht '5\' 2"'$  (1.575 m)   Wt 139 lb (63 kg)   LMP 12/23/2021   SpO2 96%   BMI 25.42 kg/m   GEN: No acute distress; alert,appropriate. PULM: Breathing comfortably in no respiratory distress PSYCH: Normally interactive.   CERVICAL SPINE EXAM Range of motion: Flexion, extension, lateral bending, and rotation: Full Pain with terminal motion: Minimal Spinous Processes: NT SCM: NT Upper paracervical muscles: Minimal Upper traps: minimal C5-T1 intact, sensation and motor intact with the exception of  the volar aspect of right-sided third and fourth digits.  Laboratory and Imaging Data: CLINICAL DATA:  Posterior neck pain.   EXAM: CERVICAL SPINE - COMPLETE 4+ VIEW   COMPARISON:  None.   FINDINGS: There is reversal of normal cervical lordosis. Spinal alignment is normal. There is no evidence for fracture. There is disc space narrowing at C5-C6 and C6-C7 compatible with mild to moderate degenerative change. Scattered endplate osteophytes are seen throughout. There is no prevertebral soft tissue swelling. Neural foramina are patent bilaterally. C1-C2 interval is within normal limits.   IMPRESSION: 1. No acute fracture or malalignment. 2. Erosive normal cervical lordosis may be related to muscle spasm or mild-to-moderate degenerative changes of the lower cervical spine.     Electronically Signed   By: Ronney Asters M.D.   On: 08/25/2021 22:33    Assessment and Plan:     ICD-10-CM   1. Right cervical radiculopathy  M54.12     2. DDD (degenerative disc disease), cervical  M50.30      Predisposition with spinal arthropathy, most likely nerve encroachment at C5-6 or C6-C7 which correlates with the area of degenerative change.  I would anticipate that this will do well.  I reviewed with her a [place]enzie protocol to work on neck positioning, and if her symptoms persist over the next 3 to 4 weeks then formal physical therapy and potential traction would be reasonable.  For now, pulse with steroids.  Flexeril as needed for  pain  Medication Management during today's office visit: Meds ordered this encounter  Medications   predniSONE (DELTASONE) 20 MG tablet    Sig: 2 tabs po daily for 5 days, then 1 tab po daily for 5 days    Dispense:  15 tablet    Refill:  0   Medications Discontinued During This Encounter  Medication Reason   mometasone (ELOCON) 0.1 % cream Completed Course   hydrocortisone (ANUSOL-HC) 2.5 % rectal cream Completed Course    Orders placed today for  conditions managed today: No orders of the defined types were placed in this encounter.   Disposition: No follow-ups on file.  Dragon Medical One speech-to-text software was used for transcription in this dictation.  Possible transcriptional errors can occur using Editor, commissioning.   Signed,  Maud Deed. Adali Pennings, MD   Outpatient Encounter Medications as of 01/17/2022  Medication Sig   cyclobenzaprine (FLEXERIL) 10 MG tablet Take 1 tablet (10 mg total) by mouth 3 (three) times daily as needed for muscle spasms.   Multiple Vitamins-Minerals (MULTIVITAMIN ADULT PO) Take 1 capsule by mouth daily.   predniSONE (DELTASONE) 20 MG tablet 2 tabs po daily for 5 days, then 1 tab po daily for 5 days   [DISCONTINUED] hydrocortisone (ANUSOL-HC) 2.5 % rectal cream Place 1 application. rectally 2 (two) times daily.   [DISCONTINUED] mometasone (ELOCON) 0.1 % cream Apply 1 Application topically as directed. Qd-bid 5 times per week for 3 weeks   No facility-administered encounter medications on file as of 01/17/2022.

## 2022-01-25 ENCOUNTER — Ambulatory Visit: Payer: BC Managed Care – PPO | Admitting: Family

## 2023-04-03 IMAGING — DX DG CERVICAL SPINE COMPLETE 4+V
6 series · 6 of 6 positions shown · non-contrast
Comparison: None.

CLINICAL DATA: Posterior neck pain.

EXAM:
CERVICAL SPINE - COMPLETE 4+ VIEW

[cervical spine ap]
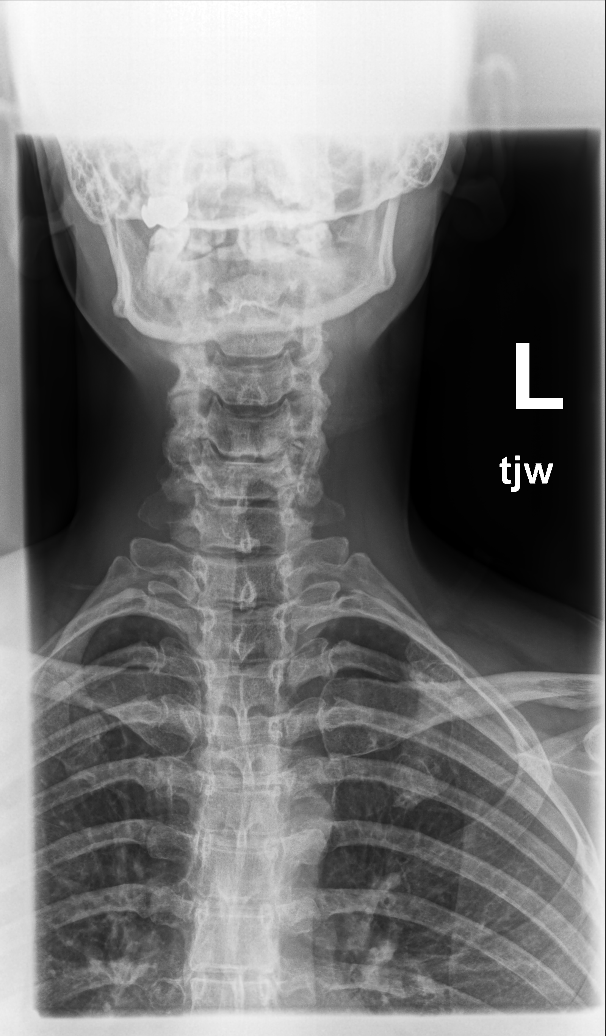

[cervical spine open mouth ap]
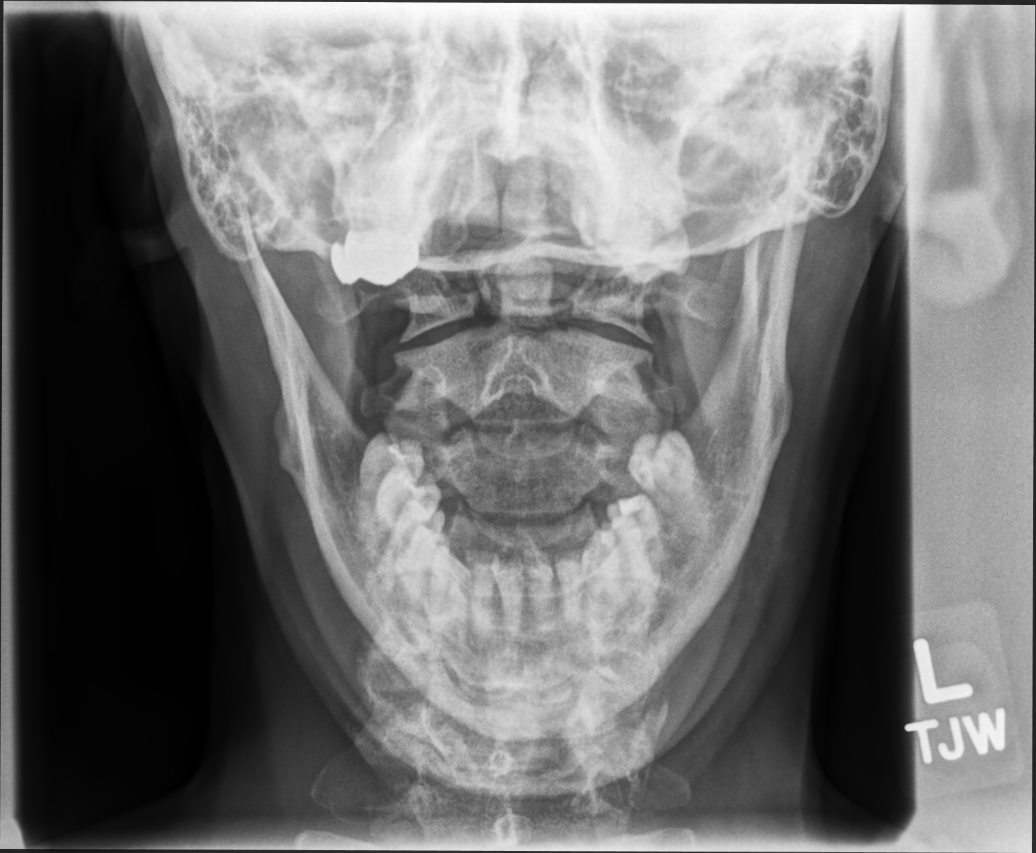

[cervical spine (swimmers) lat]
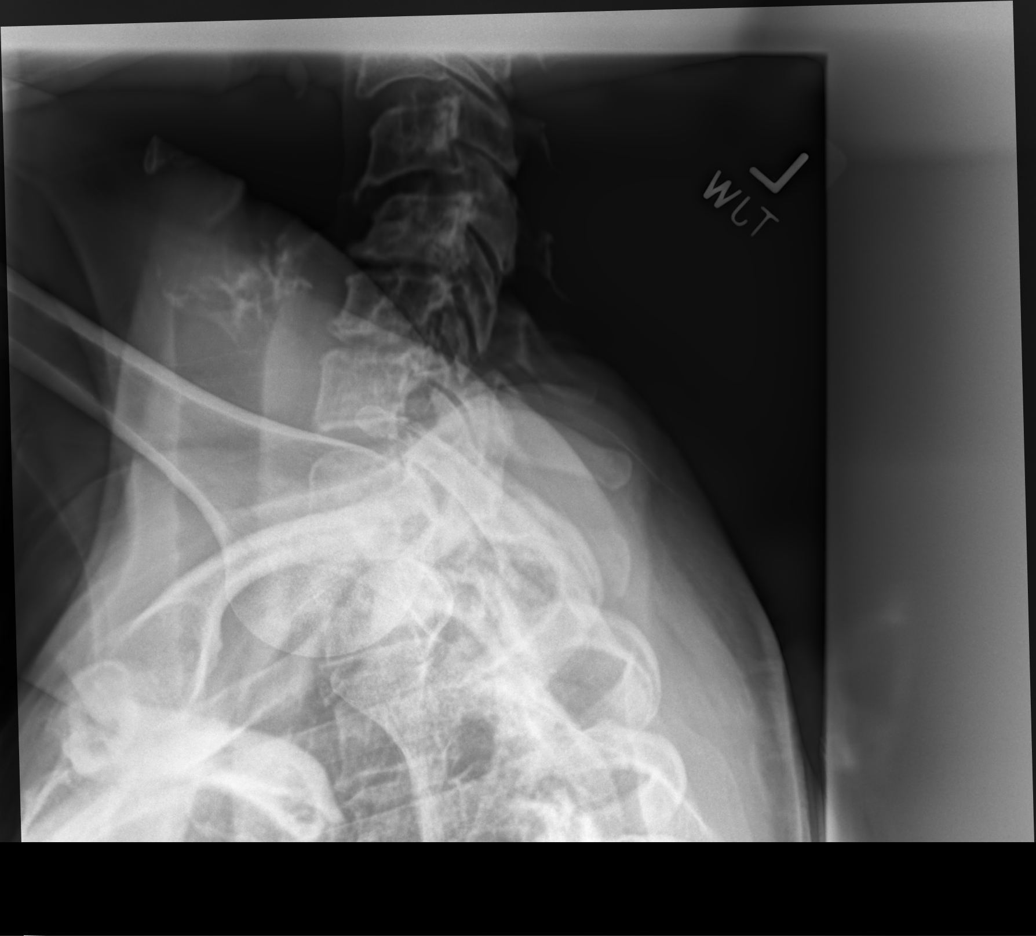

[cervical spine lat]
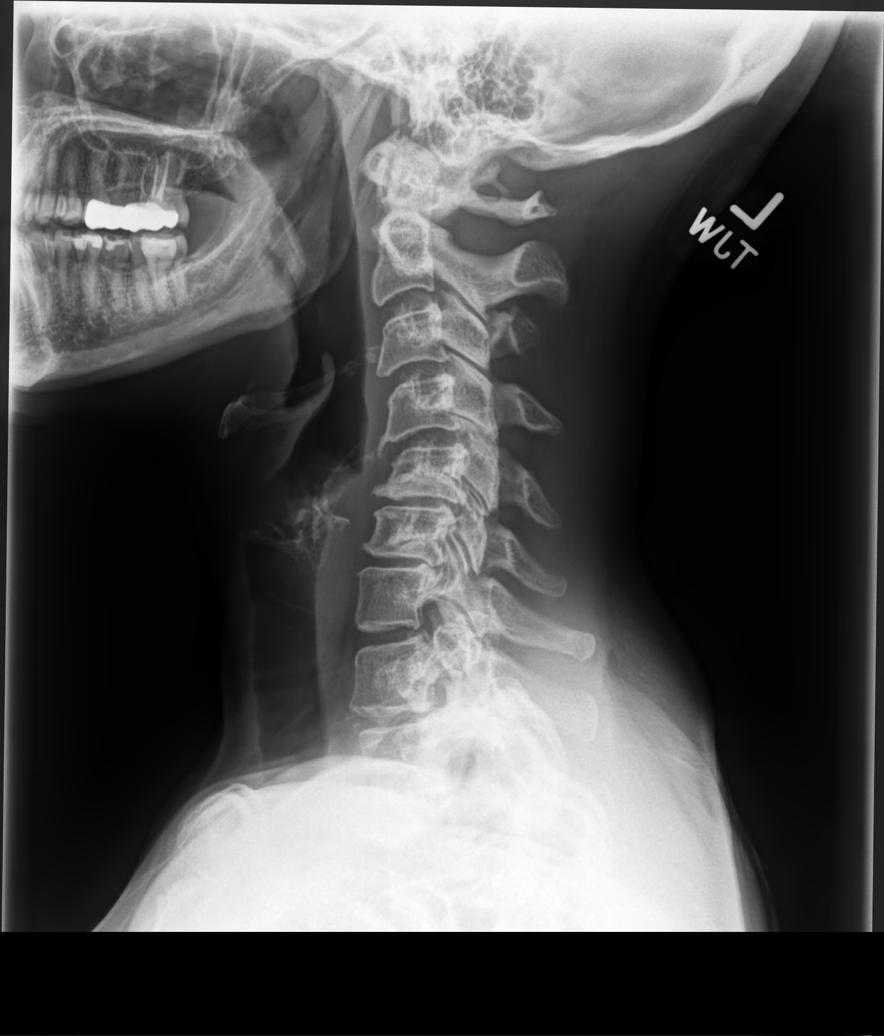

[cervical spine oblique lmo (1 of 2)]
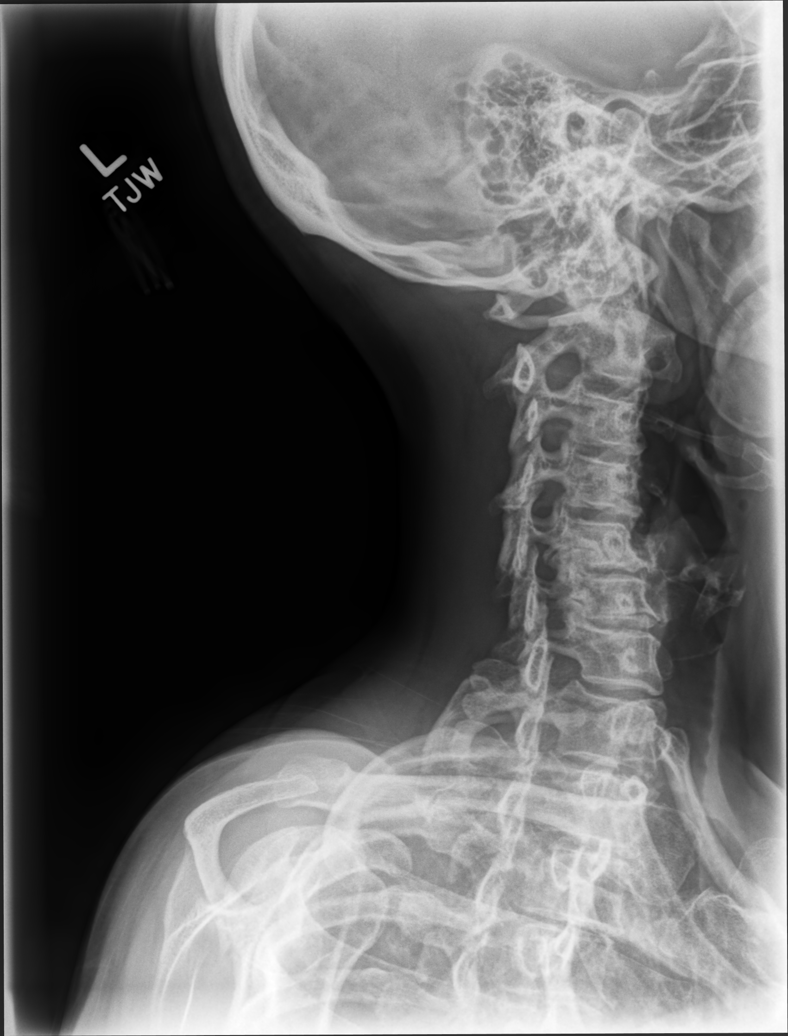

[cervical spine oblique lmo (2 of 2)]
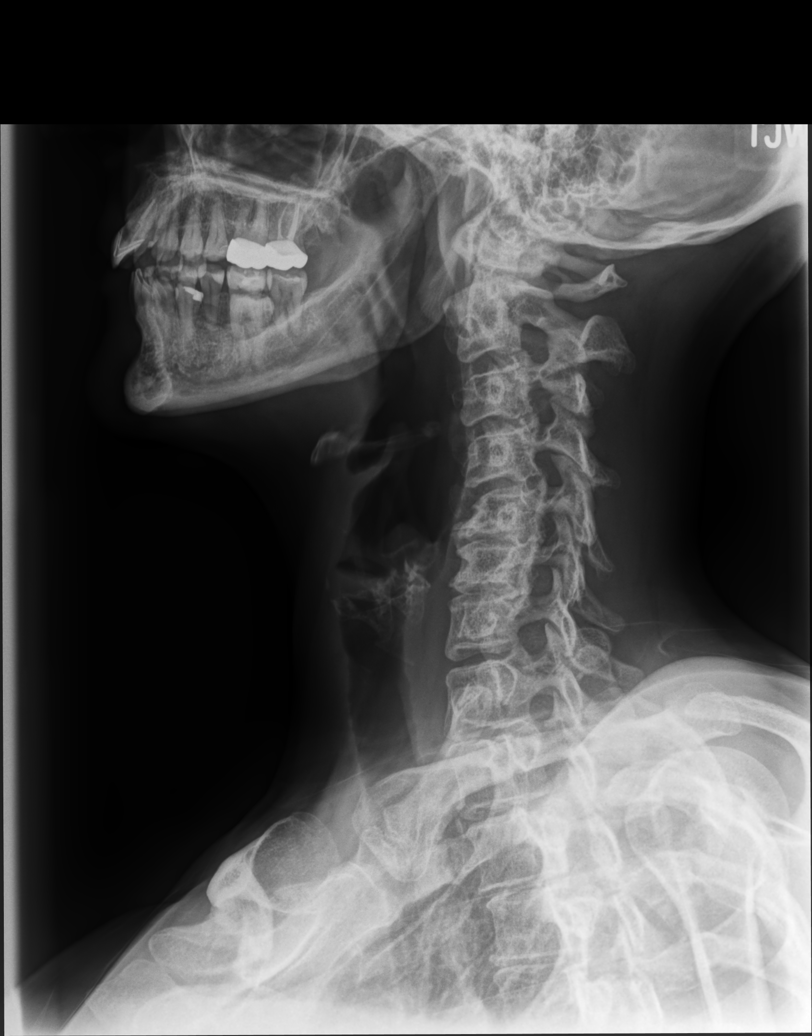

[6 of 6 positions shown; findings below may reference images not displayed]

FINDINGS: There is reversal of normal cervical lordosis. Spinal alignment is
normal. There is no evidence for fracture. There is disc space
narrowing at C5-C6 and C6-C7 compatible with mild to moderate
degenerative change. Scattered endplate osteophytes are seen
throughout. There is no prevertebral soft tissue swelling. Neural
foramina are patent bilaterally. C1-C2 interval is within normal
limits.
IMPRESSION: 1. No acute fracture or malalignment.
2. Erosive normal cervical lordosis may be related to muscle spasm
or mild-to-moderate degenerative changes of the lower cervical
spine.

## 2023-09-08 ENCOUNTER — Ambulatory Visit
Admission: RE | Admit: 2023-09-08 | Discharge: 2023-09-08 | Disposition: A | Discharge status: Home / Self Care | Source: Ambulatory Visit | Attending: Family | Admitting: Family

## 2023-09-08 ENCOUNTER — Encounter: Payer: Self-pay | Admitting: Family

## 2023-09-08 ENCOUNTER — Ambulatory Visit: Admitting: Family

## 2023-09-08 VITALS — BP 118/64 | HR 66 | Temp 98.1°F | Ht 62.0 in | Wt 159.0 lb

## 2023-09-08 DIAGNOSIS — Z13220 Encounter for screening for lipoid disorders: Secondary | ICD-10-CM

## 2023-09-08 DIAGNOSIS — M5030 Other cervical disc degeneration, unspecified cervical region: Secondary | ICD-10-CM | POA: Diagnosis not present

## 2023-09-08 DIAGNOSIS — R635 Abnormal weight gain: Secondary | ICD-10-CM | POA: Diagnosis not present

## 2023-09-08 DIAGNOSIS — M792 Neuralgia and neuritis, unspecified: Secondary | ICD-10-CM | POA: Diagnosis not present

## 2023-09-08 DIAGNOSIS — E663 Overweight: Secondary | ICD-10-CM | POA: Diagnosis not present

## 2023-09-08 DIAGNOSIS — M5412 Radiculopathy, cervical region: Secondary | ICD-10-CM | POA: Insufficient documentation

## 2023-09-08 DIAGNOSIS — M62838 Other muscle spasm: Secondary | ICD-10-CM

## 2023-09-08 LAB — LIPID PANEL
Cholesterol: 222 mg/dL — ABNORMAL HIGH (ref 0–200)
HDL: 92.8 mg/dL (ref >39.00)
LDL Cholesterol: 116 mg/dL — ABNORMAL HIGH (ref 0–99)
NonHDL: 129.55
Total CHOL/HDL Ratio: 2
Triglycerides: 69 mg/dL (ref 0.0–149.0)
VLDL: 13.8 mg/dL (ref 0.0–40.0)

## 2023-09-08 LAB — VITAMIN B12: Vitamin B-12: 248 pg/mL (ref 211–911)

## 2023-09-08 LAB — TSH: TSH: 1.55 u[IU]/mL (ref 0.35–5.50)

## 2023-09-08 LAB — T4, FREE: Free T4: 0.72 ng/dL (ref 0.60–1.60)

## 2023-09-08 MED ORDER — GABAPENTIN 100 MG PO CAPS: 100.0000 mg | ORAL_CAPSULE | Freq: Three times a day (TID) | ORAL | 3 refills | Status: DC

## 2023-09-08 MED ORDER — TIZANIDINE HCL 4 MG PO TABS: 4.0000 mg | ORAL_TABLET | Freq: Four times a day (QID) | ORAL | 0 refills | Status: DC | PRN

## 2023-09-08 NOTE — Assessment & Plan Note (Signed)
 Acute on chronic Trial gabapentin 300 mg nightly  Neck exercises discussed Heat to site  Ibuprofen/tylenol prn   Reviewed xray cervical spine 2023  Referral placed for physical therapy, rehab exercises printed as well  Consider MRI  Referral to neurosurg for eval/treat

## 2023-09-08 NOTE — Progress Notes (Signed)
 Established Patient Office Visit  Subjective:   Patient ID: Tonya Ruiz, female    DOB: 07/17/1970  Age: 53 y.o. MRN: [id number]  CC:  Chief Complaint  Patient presents with   Numbness    Numbness in fingers from neck/shoulder blade pain ongoing for 2 months    HPI: Tonya Ruiz is a 53 y.o. female presenting on 09/08/2023 for Numbness (Numbness in fingers from neck/shoulder blade pain ongoing for 2 months)  Over the last two months she has noticed finger numbness and noticed it first in the night time which work her up and used to only be tip of the middle and ring finger, but since has moved to pointer finger and thumb but this is less severe. Has started to occur in the daytime as well. Denies neck pain but does have neck tightness. She did try ibuprofen it helped but not completely. She has been with a chiropractor since January, they completed shock wave therapy but it only provided temporary relief.   Dg cervical spine: no acute fracture. Erosive normal cervical lordosis and mild to moderate degenerative changes in lower cervical spine.   Overweight, goes to the gym four times a week, mixture of weights and cardio. Started to work out in November 2024. Diet: tries to eat organic, tries to limit breads and pastas but does not entirely eliminate them. Tries to watch her serving size.   Wt Readings from Last 3 Encounters:  09/08/23 159 lb (72.1 kg)  01/17/22 139 lb (63 kg)  11/21/21 130 lb (59 kg)           ROS: Negative unless specifically indicated above in HPI.   Relevant past medical history reviewed and updated as indicated.   Allergies and medications reviewed and updated.   Current Outpatient Medications:    gabapentin (NEURONTIN) 100 MG capsule, Take 1 capsule (100 mg total) by mouth 3 (three) times daily., Disp: 90 capsule, Rfl: 3   Multiple Vitamins-Minerals (MULTIVITAMIN ADULT PO), Take 1 capsule by mouth daily., Disp: , Rfl:    tiZANidine (ZANAFLEX) 4 MG  tablet, Take 1 tablet (4 mg total) by mouth every 6 (six) hours as needed for muscle spasms., Disp: 30 tablet, Rfl: 0  Allergies  Allergen Reactions   Naproxen Sodium [Naproxen] Other (See Comments)    indigestion     Objective:   BP 118/64   Pulse 66   Temp 98.1 F (36.7 C) (Temporal)   Ht 5\' 2"  (1.575 m)   Wt 159 lb (72.1 kg)   LMP  (LMP Unknown)   SpO2 98%   BMI 29.08 kg/m    Physical Exam Constitutional:      General: She is not in acute distress.    Appearance: Normal appearance. She is normal weight. She is not ill-appearing, toxic-appearing or diaphoretic.  HENT:     Head: Normocephalic.  Cardiovascular:     Rate and Rhythm: Normal rate.  Pulmonary:     Effort: Pulmonary effort is normal.  Musculoskeletal:        General: Normal range of motion.     Cervical back: No rigidity. Muscular tenderness present. No pain with movement or spinous process tenderness. Normal range of motion.  Lymphadenopathy:     Cervical:     Left cervical: Deep cervical adenopathy: neg phalens.  Neurological:     General: No focal deficit present.     Mental Status: She is alert and oriented to person, place, and time. Mental status is at baseline.  Psychiatric:        Mood and Affect: Mood normal.        Behavior: Behavior normal.        Thought Content: Thought content normal.        Judgment: Judgment normal.     Assessment & Plan:  Neuralgia -     DG Cervical Spine Complete; Future -     Vitamin B12 -     Ambulatory referral to Physical Therapy -     Ambulatory referral to Neurosurgery  Right cervical radiculopathy Assessment & Plan: Acute on chronic Trial gabapentin 300 mg nightly  Neck exercises discussed Heat to site  Ibuprofen/tylenol prn   Reviewed xray cervical spine 2023  Referral placed for physical therapy, rehab exercises printed as well  Consider MRI  Referral to neurosurg for eval/treat  Orders: -     tiZANidine HCl; Take 1 tablet (4 mg total) by  mouth every 6 (six) hours as needed for muscle spasms.  Dispense: 30 tablet; Refill: 0 -     Ambulatory referral to Physical Therapy -     Ambulatory referral to Neurosurgery -     Gabapentin; Take 1 capsule (100 mg total) by mouth 3 (three) times daily.  Dispense: 90 capsule; Refill: 3  DDD (degenerative disc disease), cervical -     DG Cervical Spine Complete; Future  Abnormal weight gain Assessment & Plan: Tsh free t4 ordered pending results Consider menopause as well  Orders: -     TSH -     T4, free  Screening for lipoid disorders -     Lipid panel  Muscle spasm -     tiZANidine HCl; Take 1 tablet (4 mg total) by mouth every 6 (six) hours as needed for muscle spasms.  Dispense: 30 tablet; Refill: 0  Overweight (BMI 25.0-29.9) Assessment & Plan: Referral to nutritionist Basic discussion with calorie deficit counting Increase fiber Portion control  Exercise as tolerated  Orders: -     Referral to Nutrition and Diabetes Services     Follow up plan: Return if symptoms worsen or fail to improve.  Felicita Horns, FNP

## 2023-09-08 NOTE — Assessment & Plan Note (Signed)
 Tsh free t4 ordered pending results Consider menopause as well

## 2023-09-08 NOTE — Assessment & Plan Note (Signed)
 Referral to nutritionist Basic discussion with calorie deficit counting Increase fiber Portion control  Exercise as tolerated

## 2023-09-17 NOTE — Progress Notes (Unsigned)
 Referring Physician:  Felicita Horns, FNP [address] [place],  [place] [address]  Primary Physician:  Felicita Horns, FNP  History of Present Illness: 09/22/23 [name] is here today with a chief complaint of bilateral hand numbness and tingling, right> left.  This is also associated with occasional neck and right shoulder pain.  This is been on and off for 10 to 12 years however recently has become worse over the past 6 months.  It has started to wake her up from sleep at night and she has to dangle her arm in shake it in order for her to feel normal again.  The numbness and tingling is primarily in her 3rd and 4th digit as well as into her thumb.  This is more bothersome to her than her neck.  However she does state that her neck feels tight and stiff at times.  At first numbness and tingling in her hand was only at night however now it is occurring during the day.  She feels as though her fine motor skills are decreasing and she is having an increased issue with her grip.  She is right-hand dominant.    Duration: 10-12 years, much worse over 6 months  Severity: 2/10  Precipitating: aggravated by posture, sleep Modifying factors: made better by activity, arm movement Weakness: none Timing: constant Bowel/Bladder Dysfunction: none  Conservative measures: seen a chiropractor and had "shock wave" Physical therapy: has not participated in, she is trying to get scheduled for this Multimodal medical therapy including regular antiinflammatories: ibuprofen, gabapentin , tizanidine   Injections: no epidural steroid injections  Past Surgery:no spinal surgeries  Tonya Ruiz has no symptoms of cervical myelopathy.  The symptoms are causing a significant impact on the patient's life.   Review of Systems:  A 10 point review of systems is negative, except for the pertinent positives and negatives detailed in the HPI.  Past Medical History: Past Medical History:  Diagnosis Date    Allergy    Arthritis    Chicken pox    Positive TB test     Past Surgical History: Past Surgical History:  Procedure Laterality Date   COLONOSCOPY WITH PROPOFOL  N/A 11/21/2021   Procedure: COLONOSCOPY WITH PROPOFOL ;  Surgeon: Selena Daily, MD;  Location: [facility] ENDOSCOPY;  Service: Gastroenterology;  Laterality: N/A;   wisdom teeth     WRIST GANGLION EXCISION Left 2000    Allergies: Allergies as of 09/22/2023 - Review Complete 09/22/2023  Allergen Reaction Noted   Naproxen sodium [naproxen] Other (See Comments) 09/20/2014    Medications: Outpatient Encounter Medications as of 09/22/2023  Medication Sig   Multiple Vitamins-Minerals (MULTIVITAMIN ADULT PO) Take 1 capsule by mouth daily.   tiZANidine  (ZANAFLEX ) 4 MG tablet Take 1 tablet (4 mg total) by mouth every 6 (six) hours as needed for muscle spasms.   [DISCONTINUED] gabapentin  (NEURONTIN ) 100 MG capsule Take 1 capsule (100 mg total) by mouth 3 (three) times daily.   No facility-administered encounter medications on file as of 09/22/2023.    Social History: Social History   Tobacco Use   Smoking status: Former    Current packs/day: 0.00    Average packs/day: 1 pack/day for 12.0 years (12.0 ttl pk-yrs)    Types: Cigarettes    Start date: 89    Quit date: 2010    Years since quitting: 15.3   Smokeless tobacco: Never   Tobacco comments:    quit 8 years ago  Vaping Use   Vaping status:  Never Used  Substance Use Topics   Alcohol use: Not Currently    Comment: very rarely   Drug use: No    Family Medical History: Family History  Problem Relation Age of Onset   Diabetes type II Mother    Arthritis Father    Cancer Father        Lung cancer, in remission   Arthritis Paternal Grandmother    Cancer Paternal Grandmother        lung cancer   Diabetes Paternal Grandmother    Celiac disease Paternal Grandmother    Heart attack Paternal Grandmother        self induced/ accidental overdose    Physical  Examination: @VITALWITHPAIN @  General: Patient is well developed, well nourished, calm, collected, and in no apparent distress. Attention to examination is appropriate.  Psychiatric: Patient is non-anxious.  Head:  Pupils equal, round, and reactive to light.  ENT:  Oral mucosa appears well hydrated.  Neck:   Supple.  Full range of motion.  Respiratory: Patient is breathing without any difficulty.  Extremities: No edema.  Vascular: Palpable dorsal pedal pulses.  Skin:   On exposed skin, there are no abnormal skin lesions.  NEUROLOGICAL:     Awake, alert, oriented to person, place, and time.  Speech is clear and fluent. Fund of knowledge is appropriate.   Cranial Nerves: Pupils equal round and reactive to light.  Facial tone is symmetric.    ROM of spine: Mild tenderness to palpation of her cervical paraspinals.  She has full range of her cervical spine.  No significant thenar or hypothenar wasting noted Positive carpal compression test bilaterally, positive Tinel on the right side only, positive reverse Phalen's right upper extremity.  Strength: Side Biceps Triceps Deltoid Interossei Grip Wrist Ext. Wrist Flex.  R 5 5 5  4+ 4 5 5   L 5 5 5 5 5 5 5     Reflexes are 2+ and symmetric at the biceps, triceps, brachioradialis.   Hoffman's is absent.  Sensation intact to light touch however some decrease sensation in her 3rd and 4th digit of her right hand compared to the left. Gait is normal.   No difficulty with tandem gait.   No evidence of dysmetria noted.  Medical Decision Making  Imaging: Cervical x-rays completed on 09/08/2023 which does show multiple areas of degenerative disc disease.  I have personally reviewed the images and agree with the above interpretation.  Assessment and Plan: [name] is a pleasant 53 y.o. female is here today with a chief complaint of bilateral hand numbness and tingling, right> left.  This is also associated with occasional neck and right  shoulder pain.  This is been on and off for 10 to 12 years however recently has become worse over the past 6 months.  It has started to wake her up from sleep at night and she has to dangle her arm in shake it in order for her to feel normal again.  The numbness and tingling is primarily in her 3rd and 4th digit as well as into her thumb.  This is more bothersome to her than her neck.  However she does state that her neck feels tight and stiff at times.  At first numbness and tingling in her hand was only at night however now it is occurring during the day.  She feels as though her fine motor skills are decreasing and she is having an increased issue with her grip.  She is right-hand dominant.  On examination,Mild tenderness to palpation of her cervical paraspinals.  She has full range of her cervical spine. No significant thenar or hypothenar wasting noted. Positive carpal compression test bilaterally, positive Tinel on the right side only, positive reverse Phalen's right upper extremity.  Some mild grip weakness in her right hand.   Plan - Referral to neurology for EMG of right upper extremity to evaluate for right carpal tunnel syndrome versus cervical radiculopathy.  Due to positive provocative exams and history, high suspicion for carpal tunnel. - While awaiting results discussed with patient the importance of bracing to help her symptoms. -MRI cervical spine ordered - Will review results once complete.  In the meantime she is encouraged to reach out to us  for questions and concerns.  Thank you for involving me in the care of this patient.   I spent a total of 45 minutes in both face-to-face and non-face-to-face activities for this visit on the date of this encounter including reviewing the chart, history and physical exam, making referral  Ludwig Safer, PA-C Dept. of Neurosurgery

## 2023-09-22 ENCOUNTER — Ambulatory Visit: Admitting: Physician Assistant

## 2023-09-22 ENCOUNTER — Encounter: Payer: Self-pay | Admitting: Physician Assistant

## 2023-09-22 VITALS — BP 122/78 | Ht 62.0 in | Wt 157.0 lb

## 2023-09-22 DIAGNOSIS — R202 Paresthesia of skin: Secondary | ICD-10-CM | POA: Diagnosis not present

## 2023-09-22 DIAGNOSIS — R200 Anesthesia of skin: Secondary | ICD-10-CM | POA: Diagnosis not present

## 2023-09-22 DIAGNOSIS — M542 Cervicalgia: Secondary | ICD-10-CM | POA: Diagnosis not present

## 2023-09-24 ENCOUNTER — Encounter: Payer: Self-pay | Admitting: Family

## 2023-09-24 ENCOUNTER — Other Ambulatory Visit: Payer: Self-pay | Admitting: Family

## 2023-09-24 DIAGNOSIS — M5030 Other cervical disc degeneration, unspecified cervical region: Secondary | ICD-10-CM

## 2023-09-24 DIAGNOSIS — M9911 Subluxation complex (vertebral) of cervical region: Secondary | ICD-10-CM

## 2023-09-24 DIAGNOSIS — M62838 Other muscle spasm: Secondary | ICD-10-CM

## 2023-09-24 DIAGNOSIS — M5412 Radiculopathy, cervical region: Secondary | ICD-10-CM

## 2023-09-26 ENCOUNTER — Other Ambulatory Visit: Payer: Self-pay

## 2023-09-26 ENCOUNTER — Encounter: Payer: Self-pay | Admitting: Neurology

## 2023-09-26 DIAGNOSIS — R202 Paresthesia of skin: Secondary | ICD-10-CM

## 2023-09-29 ENCOUNTER — Ambulatory Visit: Admission: RE | Admit: 2023-09-29 | Source: Ambulatory Visit

## 2023-10-08 NOTE — Therapy (Signed)
 OUTPATIENT PHYSICAL THERAPY CERVICAL EVALUATION   Patient Name: Tonya Ruiz MRN: [id number] DOB:[birth year], 53 y.o., female Today's Date: 10/09/2023  END OF SESSION:  PT End of Session - 10/09/23 1422     Visit Number 1    Number of Visits 17    Date for PT Re-Evaluation 12/04/23    Progress Note Due on Visit 10    PT Start Time 1430    PT Stop Time 1511    PT Time Calculation (min) 41 min    Activity Tolerance Patient tolerated treatment well    Behavior During Therapy [facility] for tasks assessed/performed             Past Medical History:  Diagnosis Date   Allergy    Arthritis    Chicken pox    Positive TB test    Past Surgical History:  Procedure Laterality Date   COLONOSCOPY WITH PROPOFOL  N/A 11/21/2021   Procedure: COLONOSCOPY WITH PROPOFOL ;  Surgeon: Selena Daily, MD;  Location: [facility] ENDOSCOPY;  Service: Gastroenterology;  Laterality: N/A;   wisdom teeth     WRIST GANGLION EXCISION Left 2000   Patient Active Problem List   Diagnosis Date Noted   DDD (degenerative disc disease), cervical 09/08/2023   Right cervical radiculopathy 09/08/2023   Abnormal weight gain 09/08/2023   Overweight (BMI 25.0-29.9) 09/08/2023   Polyp of sigmoid colon    Hemorrhoids 08/24/2021   Irregular heart beat 07/27/2021   Abnormal skin growth 07/27/2021   Mixed hyperlipidemia 07/24/2021   Seasonal allergies 09/20/2014    PCP: Felicita Horns, FNP   REFERRING PROVIDER: Felicita Horns, FNP   REFERRING DIAG:  M79.2 (ICD-10-CM) - Neuralgia M54.12 (ICD-10-CM) - Right cervical radiculopathy  THERAPY DIAG:  Carpal tunnel syndrome of right wrist  Muscle weakness (generalized)  Rationale for Evaluation and Treatment: Rehabilitation  ONSET DATE: chronic   SUBJECTIVE:                                                                                                                                                                                                         SUBJECTIVE  STATEMENT: Patient reports that she has had 2 different opinions with one being that her pain/numbness is coming from the neck or carpal tunnel in her R hand. Numbness in the R hand and pain in the upper arm. Limits her strength in R hand and she feels that it has diminished. She reports stiffness and limitation in her neck (R>L). MRI was recently approved. She is having EMG on June 16th. Hand dominance: Right  PERTINENT HISTORY:  Per MD note 09/22/23, " chief complaint of bilateral hand numbness and tingling, right> left.  This is also associated with occasional neck and right shoulder pain.  This is been on and off for 10 to 12 years however recently has become worse over the past 6 months.  It has started to wake her up from sleep at night and she has to dangle her arm in shake it in order for her to feel normal again. The numbness and tingling is primarily in her 3rd and 4th digit as well as into her thumb. This is more bothersome to her than her neck.  However she does state that her neck feels tight and stiff at times. At first numbness and tingling in her hand was only at night however now it is occurring during the day.  She feels as though her fine motor skills are decreasing and she is having an increased issue with her grip.  She is right-hand dominant."  PAIN:  Are you having pain? No  PRECAUTIONS: None  RED FLAGS: Cervical red flags: None     WEIGHT BEARING RESTRICTIONS: No  FALLS:  Has patient fallen in last 6 months? 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  DIAGNOSTIC FINDINGS:  Dg cervical spine: no acute fracture. Erosive normal cervical lordosis and mild to moderate degenerative changes in lower cervical spine.   PATIENT SURVEYS:  To be completed  next visit   COGNITION: Overall cognitive status: Within functional limits for tasks assessed  SENSATION: WFL - during flare ups, R hand is numb from ring finger to thumb   POSTURE: No Significant postural limitations  PALPATION: Compression applied to carpal tunnel with numbness reported in fingers 1-4 on R hand    CERVICAL ROM:   Active ROM A/PROM (deg) eval  Flexion WFL  Extension WFL  Right lateral flexion WFL  Left lateral flexion WFL  Right rotation WFL   Left rotation WFL   (Blank rows = not tested)  UPPER EXTREMITY ROM:   B UE AROM WFL   UPPER EXTREMITY MMT:  MMT Right eval Left eval  Shoulder flexion    Shoulder extension    Shoulder abduction    Shoulder adduction    Shoulder extension    Shoulder internal rotation    Shoulder external rotation    Middle trapezius    Lower trapezius    Elbow flexion    Elbow extension    Wrist flexion    Wrist extension    Wrist ulnar deviation    Wrist radial deviation    Wrist pronation    Wrist supination    Grip strength 44 50   (Blank rows = not tested)  CERVICAL SPECIAL TESTS:  Upper limb tension test (ULTT): Negative, Spurling's test: Negative, and Distraction test: Negative  Tinel's sign: negative; Phalen's: positive; Reverse phalen's: positive Carpal tunnel compression on R: positive  TREATMENT DATE: 10/09/23  Education provided on difference between carpal tunnel and cervical radiculopathy symptoms as well as seeking referral for OT for specialized treatment for carpal tunnel syndrome.  PATIENT EDUCATION:  Education details: carpal tunnel vs cervical radiculopathy  Person educated: Patient Education method: Explanation Education comprehension: verbalized understanding  ASSESSMENT:  CLINICAL IMPRESSION: Patient is a 53 y.o. female who was seen today for physical therapy  evaluation for R hand numbness. Patient with numbness to fingers 1-4 on R hand with positional activities. Patient with positive tests suggesting carpal tunnel syndrome on R hand. Education provided on different symptoms of carpal tunnel vs cervical radiculopathy and benefit from seeking OT referral for specialized treatment of carpal tunnel, patient verbalized understanding. Patient in agreement to reach out to MD for referral to OT and to discontinue PT at this time.   OBJECTIVE IMPAIRMENTS: decreased strength, increased fascial restrictions, impaired sensation, and impaired UE functional use.   ACTIVITY LIMITATIONS: carrying and sleeping  REHAB POTENTIAL: Good  CLINICAL DECISION MAKING: Stable/uncomplicated  EVALUATION COMPLEXITY: Low   PLAN:  PT FREQUENCY: 1x/week  PT DURATION: 1 week  PLAN FOR NEXT SESSION: to seek referral for OT session    Janine Melbourne, PT, DPT Physical Therapist - [facility]  [facility]  10/09/2023, 3:11 PM

## 2023-10-09 ENCOUNTER — Ambulatory Visit: Attending: Family

## 2023-10-09 ENCOUNTER — Encounter: Payer: Self-pay | Admitting: Physical Therapy

## 2023-10-09 DIAGNOSIS — M792 Neuralgia and neuritis, unspecified: Secondary | ICD-10-CM | POA: Insufficient documentation

## 2023-10-09 DIAGNOSIS — M5412 Radiculopathy, cervical region: Secondary | ICD-10-CM | POA: Diagnosis not present

## 2023-10-09 DIAGNOSIS — G5601 Carpal tunnel syndrome, right upper limb: Secondary | ICD-10-CM | POA: Diagnosis present

## 2023-10-09 DIAGNOSIS — M6281 Muscle weakness (generalized): Secondary | ICD-10-CM | POA: Diagnosis present

## 2023-10-13 ENCOUNTER — Encounter: Admitting: Physical Therapy

## 2023-10-21 DIAGNOSIS — R200 Anesthesia of skin: Secondary | ICD-10-CM

## 2023-10-26 DIAGNOSIS — G5601 Carpal tunnel syndrome, right upper limb: Secondary | ICD-10-CM

## 2023-10-26 HISTORY — DX: Carpal tunnel syndrome, right upper limb: G56.01

## 2023-10-28 ENCOUNTER — Encounter: Admitting: Physical Therapy

## 2023-10-30 ENCOUNTER — Encounter: Admitting: Physical Therapy

## 2023-11-03 NOTE — Progress Notes (Signed)
 Medical Nutrition Therapy   Appointment Start time:  52  Appointment End time:  1512  Primary concerns today: weight management and cholesterol concerns  Referral diagnosis: Overweight (BMI 25.0-29.9)  Preferred learning style: no preference indicate Learning readiness: ready, change in progress  NUTRITION ASSESSMENT   Clinical Medical Hx:  Past Medical History:  Diagnosis Date   Allergy    Arthritis    Chicken pox    Positive TB test     Medications:  Current Outpatient Medications:    Multiple Vitamins-Minerals (MULTIVITAMIN ADULT PO), Take 1 capsule by mouth daily., Disp: , Rfl:    tiZANidine  (ZANAFLEX ) 4 MG tablet, Take 1 tablet (4 mg total) by mouth every 6 (six) hours as needed for muscle spasms. (Patient not taking: Reported on 11/10/2023), Disp: 30 tablet, Rfl: 0  Labs:  Lab Results  Component Value Date   CHOL 222 (H) 09/08/2023   HDL 92.80 09/08/2023   LDLCALC 116 (H) 09/08/2023   TRIG 69.0 09/08/2023   CHOLHDL 2 09/08/2023   Notable Signs/Symptoms:  Wt Readings from Last 3 Encounters:  09/22/23 157 lb (71.2 kg)  09/08/23 159 lb (72.1 kg)  01/17/22 139 lb (63 kg)    BMI Readings from Last 3 Encounters:  09/22/23 28.72 kg/m  09/08/23 29.08 kg/m  01/17/22 25.42 kg/m   Lifestyle & Dietary Hx Pt presents today alone. Pt desires weight reductions and improved cholesterol. Pt report she has increased her protein intake (70-80 grams) in an effort to improve overall health. Pt reports she has reduced her starchy choices intake to 1/2 cup or less daily. 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 Pt reports she does the shopping and cooking and loves with her husband. Pt reports eating out once weekly or less. Pt reports she monitors body weight at home and states every morning between 151-152#.  All Pt's questions were answered during this encounter.   Estimated daily fluid intake: 64-80 oz Supplements: fiber gummi daily Sleep: not enough 5-6 hours on  average Stress / self-care: 6-7 out of 10 / exercise Current average weekly physical activity: yoga 20+ 2 d/w or 3 d/w 45 minutes strengthening or cardio classes.   24-Hr Dietary Recall First Meal: skips 4/d/w or sour dough toast, eggs or omelet with onions, bell pepper, spinach, sour dough or bacon or sausage, egg, hash browns, coffee with almond sugar free creamer Snack: gold fish individual bag or none Second Meal: cafeteria lunch with popcorn chicken, broccoli, fruit or breaded chicken tenders, cilantro lime rice, black beans, green tea plain Snack: none Third Meal: shrimp, corn, mushrooms, green beans, potato, sausage, water or beer  Snack: occasionally popcorn or chips and salsa Beverages: green tea with stevia occasionally, water, 12 beer ultra, shakology protein shake made with water   NUTRITION DIAGNOSIS  NB-1.1 Food and nutrition-related knowledge deficit As related to no prior nutrition related .  As evidenced by Pt's.  NUTRITION INTERVENTION  Nutrition education (E-1) on the following topics:  Fruits & Vegetables: Aim to fill half your plate with a variety of fruits and vegetables. They are rich in vitamins, minerals, and fiber, and can help reduce the risk of chronic diseases. Choose a colorful assortment of fruits and vegetables to ensure you get a wide range of nutrients. Grains and Starches: Make at least half of your grain choices whole grains, such as brown rice, whole wheat bread, and oats. Whole grains provide fiber, which aids in digestion and healthy cholesterol levels. Aim for whole forms  of starchy vegetables such as potatoes, sweet potatoes, beans, peas, and corn, which are fiber rich and provide many vitamins and minerals.  Protein: Incorporate lean sources of protein, such as poultry, fish, beans, nuts, and seeds, into your meals. Protein is essential for building and repairing tissues, staying full, balancing blood sugar, as well as supporting immune function. Dairy:  Include low-fat or fat-free dairy products like milk, yogurt, and cheese in your diet. Dairy foods are excellent sources of calcium and vitamin D, which are crucial for bone health.  Physical Activity: Aim for 60 minutes of physical activity daily. Regular physical activity promotes overall health-including helping to reduce risk for heart disease and diabetes, promoting mental health, and helping us  sleep better.    Handouts Provided Include  Snack Time! Plate Planner- [employer] Label Reading Tips-AND  Learning Style & Readiness for Change Teaching method utilized: Visual & Auditory  Demonstrated degree of understanding via: Teach Back  Barriers to learning/adherence to lifestyle change: family support  Goals Established by Pt Aim for balanced meals and snacks Continue with physical activity. Great job!  MONITORING & EVALUATION Dietary intake, weekly physical activity  Next Steps  Patient is to return PRN.

## 2023-11-04 ENCOUNTER — Encounter: Admitting: Physical Therapy

## 2023-11-06 ENCOUNTER — Encounter: Admitting: Physical Therapy

## 2023-11-07 ENCOUNTER — Ambulatory Visit: Admitting: Neurology

## 2023-11-07 DIAGNOSIS — R202 Paresthesia of skin: Secondary | ICD-10-CM

## 2023-11-07 DIAGNOSIS — G5603 Carpal tunnel syndrome, bilateral upper limbs: Secondary | ICD-10-CM

## 2023-11-07 NOTE — Procedures (Signed)
 [facility]  [address] [place], [address]  [place], [place] [address] Tel: [phone] Fax: [phone] Test Date:  11/07/2023  Patient: Tonya Ruiz DOB: 1970-10-29 Physician: Reyna Cava, DO  Sex: Female Height: 5' 2 Ref Phys: Ludwig Safer, Kirby Peoples  ID#: [id number]   Technician:    History: This is a 53 year old female referred for evaluation of bilateral hand paresthesias, worse on the right.  NCV & EMG Findings: Extensive electrodiagnostic testing of the right upper extremity and additional studies of the left shows:  Right median sensory response shows prolonged latency (4.7 ms) and reduced amplitude (11.7 V).  Left median sensory response shows mildly prolonged latency (3.7 ms).  Bilateral ulnar sensory responses are within normal limits. Right median motor response shows prolonged latency (5.1 ms).  Left median and bilateral ulnar motor responses are within normal limits.   There is no evidence of active or chronic motor axonal loss changes affecting any of the tested muscles.  Motor unit configuration and recruitment pattern is within normal limits.    Impression: Right median neuropathy at or distal to the wrist, consistent with a clinical diagnosis of carpal tunnel syndrome, moderate. Left median neuropathy at or distal to the wrist, consistent with a clinical diagnosis of carpal tunnel syndrome, very mild.   ___________________________ Reyna Cava, DO    Nerve Conduction Studies   Stim Site NR Peak (ms) Norm Peak (ms) O-P Amp (V) Norm O-P Amp  Left Median Anti Sensory (2nd Digit)  32 C  Wrist    *3.7 <3.6 29.5 >15  Right Median Anti Sensory (2nd Digit)  32 C  Wrist    *4.7 <3.6 *11.7 >15  Left Ulnar Anti Sensory (5th Digit)  32 C  Wrist    2.6 <3.1 41.2 >10  Right Ulnar Anti Sensory (5th Digit)  32 C  Wrist    2.6 <3.1 41.2 >10     Stim Site NR Onset (ms) Norm Onset (ms) O-P Amp (mV) Norm O-P Amp Site1 Site2 Delta-0 (ms) Dist (cm) Vel (m/s)  Norm Vel (m/s)  Left Median Motor (Abd Poll Brev)  32 C  Wrist    3.3 <4.0 7.5 >6 Elbow Wrist 4.4 27.0 61 >50  Elbow    7.7  7.2         Right Median Motor (Abd Poll Brev)  32 C  Wrist    *5.1 <4.0 7.1 >6 Elbow Wrist 4.8 27.0 56 >50  Elbow    9.9  6.7         Left Ulnar Motor (Abd Dig Minimi)  32 C  Wrist    1.9 <3.1 10.2 >7 B Elbow Wrist 2.8 19.0 68 >50  B Elbow    4.7  9.8  A Elbow B Elbow 1.7 10.0 59 >50  A Elbow    6.4  9.6         Right Ulnar Motor (Abd Dig Minimi)  32 C  Wrist    2.0 <3.1 11.1 >7 B Elbow Wrist 2.8 19.0 68 >50  B Elbow    4.8  11.0  A Elbow B Elbow 1.6 10.0 62 >50  A Elbow    6.4  11.0          Electromyography   Side Muscle Ins.Act Fibs Fasc Recrt Amp Dur Poly Activation Comment  Right 1stDorInt Nml Nml Nml Nml Nml Nml Nml Nml N/A  Right Abd Poll Brev Nml Nml Nml Nml Nml Nml Nml Nml N/A  Right PronatorTeres Nml  Nml Nml Nml Nml Nml Nml Nml N/A  Right Biceps Nml Nml Nml Nml Nml Nml Nml Nml N/A  Right Triceps Nml Nml Nml Nml Nml Nml Nml Nml N/A  Right Deltoid Nml Nml Nml Nml Nml Nml Nml Nml N/A  Left 1stDorInt Nml Nml Nml Nml Nml Nml Nml Nml N/A  Left Abd Poll Brev Nml Nml Nml Nml Nml Nml Nml Nml N/A  Left PronatorTeres Nml Nml Nml Nml Nml Nml Nml Nml N/A  Left Biceps Nml Nml Nml Nml Nml Nml Nml Nml N/A  Left Triceps Nml Nml Nml Nml Nml Nml Nml Nml N/A  Left Deltoid Nml Nml Nml Nml Nml Nml Nml Nml N/A      Waveforms:

## 2023-11-10 ENCOUNTER — Encounter: Admitting: Physical Therapy

## 2023-11-10 ENCOUNTER — Encounter: Attending: Family | Admitting: Dietician

## 2023-11-10 DIAGNOSIS — E663 Overweight: Secondary | ICD-10-CM | POA: Diagnosis present

## 2023-11-10 DIAGNOSIS — Z6828 Body mass index (BMI) 28.0-28.9, adult: Secondary | ICD-10-CM | POA: Insufficient documentation

## 2023-11-10 DIAGNOSIS — Z713 Dietary counseling and surveillance: Secondary | ICD-10-CM | POA: Insufficient documentation

## 2023-11-10 NOTE — Telephone Encounter (Signed)
 She confirmed appt 11/12/23

## 2023-11-12 ENCOUNTER — Encounter: Admitting: Physical Therapy

## 2023-11-12 ENCOUNTER — Ambulatory Visit: Admitting: Physician Assistant

## 2023-11-12 DIAGNOSIS — R202 Paresthesia of skin: Secondary | ICD-10-CM

## 2023-11-12 DIAGNOSIS — G5603 Carpal tunnel syndrome, bilateral upper limbs: Secondary | ICD-10-CM | POA: Diagnosis not present

## 2023-11-12 NOTE — H&P (View-Only) (Signed)
 11/12/2023  Spoke with patient over the phone regarding EMG results.  Full EMG results as below.  Moderate right carpal tunnel syndrome found as well as mild on the left.  Patient continues to have numbness and tingling in her right hand, but wearing a brace at night has helped tremendously.  Risk and benefits regarding carpal tunnel release surgery were discussed at length.  She would like to discuss this further with her spouse will contact us  regarding a decision.        [facility]  [address] [place], [address]  [place], [place] [address] Tel: [phone] Fax: [phone] Test Date:  11/07/2023   Patient: Tonya Ruiz DOB: Sep 09, 1970 Physician: Reyna Cava, DO  Sex: Female Height: 5' 2 Ref Phys: Ludwig Safer, Kirby Peoples  ID#: [id number]     Technician:      History: This is a 53 year old female referred for evaluation of bilateral hand paresthesias, worse on the right.   NCV & EMG Findings: Extensive electrodiagnostic testing of the right upper extremity and additional studies of the left shows:  Right median sensory response shows prolonged latency (4.7 ms) and reduced amplitude (11.7 V).  Left median sensory response shows mildly prolonged latency (3.7 ms).  Bilateral ulnar sensory responses are within normal limits. Right median motor response shows prolonged latency (5.1 ms).  Left median and bilateral ulnar motor responses are within normal limits.   There is no evidence of active or chronic motor axonal loss changes affecting any of the tested muscles.  Motor unit configuration and recruitment pattern is within normal limits.     Impression: Right median neuropathy at or distal to the wrist, consistent with a clinical diagnosis of carpal tunnel syndrome, moderate. Left median neuropathy at or distal to the wrist, consistent with a clinical diagnosis of carpal tunnel syndrome, very mild.     ___________________________ Reyna Cava, DO     Nerve Conduction  Studies    Stim Site NR Peak (ms) Norm Peak (ms) O-P Amp (V) Norm O-P Amp  Left Median Anti Sensory (2nd Digit)  32 C  Wrist    *3.7 <3.6 29.5 >15  Right Median Anti Sensory (2nd Digit)  32 C  Wrist    *4.7 <3.6 *11.7 >15  Left Ulnar Anti Sensory (5th Digit)  32 C  Wrist    2.6 <3.1 41.2 >10  Right Ulnar Anti Sensory (5th Digit)  32 C  Wrist    2.6 <3.1 41.2 >10       Stim Site NR Onset (ms) Norm Onset (ms) O-P Amp (mV) Norm O-P Amp Site1 Site2 Delta-0 (ms) Dist (cm) Vel (m/s) Norm Vel (m/s)  Left Median Motor (Abd Poll Brev)  32 C  Wrist    3.3 <4.0 7.5 >6 Elbow Wrist 4.4 27.0 61 >50  Elbow    7.7   7.2                Right Median Motor (Abd Poll Brev)  32 C  Wrist    *5.1 <4.0 7.1 >6 Elbow Wrist 4.8 27.0 56 >50  Elbow    9.9   6.7                Left Ulnar Motor (Abd Dig Minimi)  32 C  Wrist    1.9 <3.1 10.2 >7 B Elbow Wrist 2.8 19.0 68 >50  B Elbow    4.7   9.8   A Elbow B Elbow 1.7 10.0 59 >50  A  Elbow    6.4   9.6                Right Ulnar Motor (Abd Dig Minimi)  32 C  Wrist    2.0 <3.1 11.1 >7 B Elbow Wrist 2.8 19.0 68 >50  B Elbow    4.8   11.0   A Elbow B Elbow 1.6 10.0 62 >50  A Elbow    6.4   11.0                  Electromyography    Side Muscle Ins.Act Fibs Fasc Recrt Amp Dur Poly Activation Comment  Right 1stDorInt Nml Nml Nml Nml Nml Nml Nml Nml N/A  Right Abd Poll Brev Nml Nml Nml Nml Nml Nml Nml Nml N/A  Right PronatorTeres Nml Nml Nml Nml Nml Nml Nml Nml N/A  Right Biceps Nml Nml Nml Nml Nml Nml Nml Nml N/A  Right Triceps Nml Nml Nml Nml Nml Nml Nml Nml N/A  Right Deltoid Nml Nml Nml Nml Nml Nml Nml Nml N/A  Left 1stDorInt Nml Nml Nml Nml Nml Nml Nml Nml N/A  Left Abd Poll Brev Nml Nml Nml Nml Nml Nml Nml Nml N/A  Left PronatorTeres Nml Nml Nml Nml Nml Nml Nml Nml N/A  Left Biceps Nml Nml Nml Nml Nml Nml Nml Nml N/A  Left Triceps Nml Nml Nml Nml Nml Nml Nml Nml N/A  Left Deltoid Nml Nml Nml Nml Nml Nml Nml Nml N/A        Waveforms:                                                                                   I connected with  Tonya Ruiz on 11/12/23 via telephone and verified that I am speaking with the correct person using two identifiers.  We spoke for 10 minutes.  She was in her private residence and I was in clinic.   I discussed the limitations of evaluation and management by telemedicine. The patient expressed understanding and agreed to proceed.

## 2023-11-12 NOTE — Progress Notes (Addendum)
 11/12/2023  Spoke with patient over the phone regarding EMG results.  Full EMG results as below.  Moderate right carpal tunnel syndrome found as well as mild on the left.  Patient continues to have numbness and tingling in her right hand, but wearing a brace at night has helped tremendously.  Risk and benefits regarding carpal tunnel release surgery were discussed at length.  She would like to discuss this further with her spouse will contact us  regarding a decision.        [facility]  [address] [place], [address]  [place], [place] [address] Tel: [phone] Fax: [phone] Test Date:  11/07/2023   Patient: Tonya Ruiz DOB: Sep 09, 1970 Physician: Reyna Cava, DO  Sex: Female Height: 5' 2 Ref Phys: Tonya Ruiz, Tonya Ruiz  ID#: [id number]     Technician:      History: This is a 53 year old female referred for evaluation of bilateral hand paresthesias, worse on the right.   NCV & EMG Findings: Extensive electrodiagnostic testing of the right upper extremity and additional studies of the left shows:  Right median sensory response shows prolonged latency (4.7 ms) and reduced amplitude (11.7 V).  Left median sensory response shows mildly prolonged latency (3.7 ms).  Bilateral ulnar sensory responses are within normal limits. Right median motor response shows prolonged latency (5.1 ms).  Left median and bilateral ulnar motor responses are within normal limits.   There is no evidence of active or chronic motor axonal loss changes affecting any of the tested muscles.  Motor unit configuration and recruitment pattern is within normal limits.     Impression: Right median neuropathy at or distal to the wrist, consistent with a clinical diagnosis of carpal tunnel syndrome, moderate. Left median neuropathy at or distal to the wrist, consistent with a clinical diagnosis of carpal tunnel syndrome, very mild.     ___________________________ Reyna Cava, DO     Nerve Conduction  Studies    Stim Site NR Peak (ms) Norm Peak (ms) O-P Amp (V) Norm O-P Amp  Left Median Anti Sensory (2nd Digit)  32 C  Wrist    *3.7 <3.6 29.5 >15  Right Median Anti Sensory (2nd Digit)  32 C  Wrist    *4.7 <3.6 *11.7 >15  Left Ulnar Anti Sensory (5th Digit)  32 C  Wrist    2.6 <3.1 41.2 >10  Right Ulnar Anti Sensory (5th Digit)  32 C  Wrist    2.6 <3.1 41.2 >10       Stim Site NR Onset (ms) Norm Onset (ms) O-P Amp (mV) Norm O-P Amp Site1 Site2 Delta-0 (ms) Dist (cm) Vel (m/s) Norm Vel (m/s)  Left Median Motor (Abd Poll Brev)  32 C  Wrist    3.3 <4.0 7.5 >6 Elbow Wrist 4.4 27.0 61 >50  Elbow    7.7   7.2                Right Median Motor (Abd Poll Brev)  32 C  Wrist    *5.1 <4.0 7.1 >6 Elbow Wrist 4.8 27.0 56 >50  Elbow    9.9   6.7                Left Ulnar Motor (Abd Dig Minimi)  32 C  Wrist    1.9 <3.1 10.2 >7 B Elbow Wrist 2.8 19.0 68 >50  B Elbow    4.7   9.8   A Elbow B Elbow 1.7 10.0 59 >50  A  Elbow    6.4   9.6                Right Ulnar Motor (Abd Dig Minimi)  32 C  Wrist    2.0 <3.1 11.1 >7 B Elbow Wrist 2.8 19.0 68 >50  B Elbow    4.8   11.0   A Elbow B Elbow 1.6 10.0 62 >50  A Elbow    6.4   11.0                  Electromyography    Side Muscle Ins.Act Fibs Fasc Recrt Amp Dur Poly Activation Comment  Right 1stDorInt Nml Nml Nml Nml Nml Nml Nml Nml N/A  Right Abd Poll Brev Nml Nml Nml Nml Nml Nml Nml Nml N/A  Right PronatorTeres Nml Nml Nml Nml Nml Nml Nml Nml N/A  Right Biceps Nml Nml Nml Nml Nml Nml Nml Nml N/A  Right Triceps Nml Nml Nml Nml Nml Nml Nml Nml N/A  Right Deltoid Nml Nml Nml Nml Nml Nml Nml Nml N/A  Left 1stDorInt Nml Nml Nml Nml Nml Nml Nml Nml N/A  Left Abd Poll Brev Nml Nml Nml Nml Nml Nml Nml Nml N/A  Left PronatorTeres Nml Nml Nml Nml Nml Nml Nml Nml N/A  Left Biceps Nml Nml Nml Nml Nml Nml Nml Nml N/A  Left Triceps Nml Nml Nml Nml Nml Nml Nml Nml N/A  Left Deltoid Nml Nml Nml Nml Nml Nml Nml Nml N/A        Waveforms:                                                                                   I connected with  Tonya Ruiz on 11/12/23 via telephone and verified that I am speaking with the correct person using two identifiers.  We spoke for 10 minutes.  She was in her private residence and I was in clinic.   I discussed the limitations of evaluation and management by telemedicine. The patient expressed understanding and agreed to proceed.

## 2023-11-17 ENCOUNTER — Encounter: Admitting: Physical Therapy

## 2023-11-19 ENCOUNTER — Telehealth: Payer: Self-pay

## 2023-11-19 ENCOUNTER — Encounter: Admitting: Physical Therapy

## 2023-11-19 ENCOUNTER — Other Ambulatory Visit: Payer: Self-pay

## 2023-11-19 DIAGNOSIS — R200 Anesthesia of skin: Secondary | ICD-10-CM

## 2023-11-19 DIAGNOSIS — Z01818 Encounter for other preprocedural examination: Secondary | ICD-10-CM

## 2023-11-19 NOTE — Telephone Encounter (Signed)
 Please see below for information in regards to your upcoming surgery:   Planned surgery: Right Ultrasound Guided Carpal Tunnel Release   Surgery date: 12/02/2023 at [facility] (Medical Mall: [address] [place], [place] [address]) - you will find out your arrival time the business day before your surgery.  Pre-op appointment at [facility] Pre-admit Testing: you will receive a call with a date/time for this appointment. If you are scheduled for an in person appointment, Pre-admit Testing is located on the first floor of the [facility] building, [address] [facility], [address]. During this appointment, they will advise you which medications you can take the morning of surgery, and which medications you will need to hold for surgery. Labs (such as blood work, EKG) may be done at your pre-op appointment. You are not required to fast for these labs. Should you need to change your pre-op appointment, please call Pre-admit testing at [phone].   Common restrictions after surgery: Avoid using operative arm for 3 days after surgery. Avoid lifting objects heavier than 10 pounds for the first 6 weeks after surgery. Where possible, avoid household activities that involve lifting, bending, reaching, pushing, or pulling such as laundry, vacuuming, grocery shopping, and childcare. Try to arrange for help from friends and family for these activities while you heal. Do not drive while taking prescription pain medication. Weeks 6 through 12 after surgery: avoid lifting more than 25 pounds.   How to contact us :  If you have any questions/concerns before or after surgery, you can reach us  at [phone], or you can send a mychart message. We can be reached by phone or mychart 8am-4pm, Monday-Friday.  *Please note: Calls after 4pm are forwarded to a third party answering service. Mychart messages are not routinely monitored during evenings, weekends, and holidays. Please call our  office to contact the answering service for urgent concerns during non-business hours.  Appointments/FMLA & disability paperwork: Odetta Mora, & Ritta Registered Nurses/Surgery schedulers: Kendelyn & Evona Westra Medical Assistants: Damien ODESSIA Sailors Physician Assistants: Lyle Decamp, PA-C, Edsel Goods, PA-C & Glade Boys, PA-C Surgeons: Reeves Daisy, MD & Penne Sharps, MD   [facility] PREADMIT TESTING VISIT and SURGERY INFORMATION SHEET   Now that surgery has been scheduled you can anticipate several phone calls from [facility] services. A pharmacy technician will call you to verify your current list of medications taken at home.               The Pre-Service Center will call to verify your insurance information and to give you billing estimates and information.             The Preadmit Testing Office will be calling to schedule a visit to obtain information for the anesthesia team and provide instructions on preparation for surgery.  What can you expect for the Preadmit Testing Visit: Appointments may be scheduled in-person or by telephone.  If a telephone visit is scheduled, you may be asked to come into the office to have lab tests or other studies performed.   This visit will not be completed any greater than 14 days prior to your surgery.  If your surgery has been scheduled for a future date, please do not be alarmed if we have not contacted you to schedule an appointment more than a month prior to the surgery date.    Please be prepared to provide the following information during this appointment:            -  Personal medical history                                               -Medication and allergy list            -Any history of problems with anesthesia              -Recent lab work or diagnostic studies            -Please notify us  of any needs we should be aware of to provide the best care possible           -You will be provided with instructions on  how to prepare for your surgery.    On The Day of Surgery:  You must have a driver to take you home after surgery, you will be asked not to drive for 24 hours following surgery.  Taxi, Gisele and non-medical transport will not be acceptable means of transportation unless you have a responsible individual who will be traveling with you.  Visitors in the surgical area:   2 people will be able to visit you in your room once your preparation for surgery has been completed. During surgery, your visitors will be asked to wait in the Surgery Waiting Area.  It is not a requirement for them to stay, if they prefer to leave and come back.  Your visitor(s) will be given an update once the surgery has been completed.  No visitors are allowed in the initial recovery room to respect patient privacy and safety.  Once you are more awake and transfer to the secondary recovery area, or are transferred to an inpatient room, visitors will again be able to see you.  To respect and protect your privacy: We will ask on the day of surgery who your driver will be and what the contact number for that individual will be. We will ask if it is okay to share information with this individual, or if there is an alternative individual that we, or the surgeon, should contact to provide updates and information. If family or friends come to the surgical information desk requesting information about you, who you have not listed with us , no information will be given.   It may be helpful to designate someone as the main contact who will be responsible for updating your other friends and family.    PREADMIT TESTING OFFICE: [phone] SAME DAY SURGERY: [phone] We look forward to caring for you before and throughout the process of your surgery.

## 2023-11-24 ENCOUNTER — Encounter: Admitting: Physical Therapy

## 2023-11-26 ENCOUNTER — Other Ambulatory Visit: Payer: Self-pay

## 2023-11-26 ENCOUNTER — Encounter
Admission: RE | Admit: 2023-11-26 | Discharge: 2023-11-26 | Disposition: A | Discharge status: Home / Self Care | Source: Ambulatory Visit | Attending: Neurosurgery | Admitting: Neurosurgery

## 2023-11-26 ENCOUNTER — Encounter: Admitting: Physical Therapy

## 2023-11-26 VITALS — Ht 62.0 in | Wt 145.0 lb

## 2023-11-26 DIAGNOSIS — Z01812 Encounter for preprocedural laboratory examination: Secondary | ICD-10-CM

## 2023-11-26 HISTORY — DX: Radiculopathy, cervical region: M54.12

## 2023-11-26 HISTORY — DX: Mixed hyperlipidemia: E78.2

## 2023-11-26 HISTORY — DX: Polyp of colon: K63.5

## 2023-11-26 NOTE — Patient Instructions (Addendum)
 Your procedure is scheduled on: Tuesday, July 8 Report to the Registration Desk on the 1st floor of the [facility] To find out your arrival time, please call [phone] between 1PM - 3PM on: Monday, July 7 If your arrival time is 6:00 am, do not arrive before that time as the [facility] entrance doors do not open until 6:00 am.  REMEMBER: Instructions that are not followed completely may result in serious medical risk, up to and including death; or upon the discretion of your surgeon and anesthesiologist your surgery may need to be rescheduled.  Do not eat food after midnight the night before surgery.  No gum chewing or hard candies.  You may however, drink CLEAR liquids up to 2 hours before you are scheduled to arrive for your surgery. Do not drink anything within 2 hours of your scheduled arrival time.  Clear liquids include: - water  - apple juice without pulp - gatorade (not RED colors) - black coffee or tea (Do NOT add milk or creamers to the coffee or tea) Do NOT drink anything that is not on this list.  One week prior to surgery: starting today, July 2 Stop Anti-inflammatories (NSAIDS) such as Advil, Aleve, Ibuprofen, Motrin, Naproxen, Naprosyn and Aspirin based products such as Excedrin, Goody's Powder, BC Powder. Stop ANY OVER THE COUNTER supplements until after surgery. Stop vitamins.  You may however, continue to take Tylenol if needed for pain up until the day of surgery.  ON THE DAY OF SURGERY DO NOT TAKE ANY MEDICATIONS  No Alcohol for 24 hours before or after surgery.  No Smoking including e-cigarettes for 24 hours before surgery.  No chewable tobacco products for at least 6 hours before surgery.  No nicotine patches on the day of surgery.  Do not use any recreational drugs for at least a week (preferably 2 weeks) before your surgery.  Please be advised that the combination of cocaine and anesthesia may have negative outcomes, up to and including  death. If you test positive for cocaine, your surgery will be cancelled.  On the morning of surgery brush your teeth with toothpaste and water, you may rinse your mouth with mouthwash if you wish. Do not swallow any toothpaste or mouthwash.  Use CHG Soap as directed on instruction sheet.  Do not wear jewelry, make-up, hairpins, clips or nail polish.  For welded (permanent) jewelry: bracelets, anklets, waist bands, etc.  Please have this removed prior to surgery.  If it is not removed, there is a chance that hospital personnel will need to cut it off on the day of surgery.  Do not wear lotions, powders, or perfumes.   Do not shave body hair from the neck down 48 hours before surgery.  Contact lenses, hearing aids and dentures may not be worn into surgery.  Do not bring valuables to the hospital. [facility] is not responsible for any missing/lost belongings or valuables.   Notify your doctor if there is any change in your medical condition (cold, fever, infection).  Wear comfortable clothing (specific to your surgery type) to the hospital.  After surgery, you can help prevent lung complications by doing breathing exercises.  Take deep breaths and cough every 1-2 hours.   If you are being discharged the day of surgery, you will not be allowed to drive home. You will need a responsible individual to drive you home and stay with you for 24 hours after surgery.   If you are taking public transportation, you  will need to have a responsible individual with you.  Please call the Pre-admissions Testing Dept. at [phone] if you have any questions about these instructions.  Surgery Visitation Policy:  Patients having surgery or a procedure may have two visitors.  Children under the age of 30 must have an adult with them who is not the patient.  Merchandiser, retail to address health-related social needs:  [email or web address]     Preparing for Surgery with  CHLORHEXIDINE GLUCONATE (CHG) Soap  Chlorhexidine Gluconate (CHG) Soap  o An antiseptic cleaner that kills germs and bonds with the skin to continue killing germs even after washing  o Used for showering the night before surgery and morning of surgery  Before surgery, you can play an important role by reducing the number of germs on your skin.  CHG (Chlorhexidine gluconate) soap is an antiseptic cleanser which kills germs and bonds with the skin to continue killing germs even after washing.  Please do not use if you have an allergy to CHG or antibacterial soaps. If your skin becomes reddened/irritated stop using the CHG.  1. Shower the NIGHT BEFORE SURGERY and the MORNING OF SURGERY with CHG soap.  2. If you choose to wash your hair, wash your hair first as usual with your normal shampoo.  3. After shampooing, rinse your hair and body thoroughly to remove the shampoo.  4. Use CHG as you would any other liquid soap. You can apply CHG directly to the skin and wash gently with a scrungie or a clean washcloth.  5. Apply the CHG soap to your body only from the neck down. Do not use on open wounds or open sores. Avoid contact with your eyes, ears, mouth, and genitals (private parts). Wash face and genitals (private parts) with your normal soap.  6. Wash thoroughly, paying special attention to the area where your surgery will be performed.  7. Thoroughly rinse your body with warm water.  8. Do not shower/wash with your normal soap after using and rinsing off the CHG soap.  9. Pat yourself dry with a clean towel.  10. Wear clean pajamas to bed the night before surgery.  12. Place clean sheets on your bed the night of your first shower and do not sleep with pets.  13. Shower again with the CHG soap on the day of surgery prior to arriving at the hospital.  14. Do not apply any deodorants/lotions/powders.  15. Please wear clean clothes to the hospital.

## 2023-11-27 ENCOUNTER — Encounter: Admitting: Physical Therapy

## 2023-12-02 ENCOUNTER — Ambulatory Visit: Admitting: General Practice

## 2023-12-02 ENCOUNTER — Other Ambulatory Visit: Payer: Self-pay

## 2023-12-02 ENCOUNTER — Encounter: Admitting: Physical Therapy

## 2023-12-02 ENCOUNTER — Encounter: Payer: Self-pay | Admitting: Neurosurgery

## 2023-12-02 ENCOUNTER — Ambulatory Visit
Admission: RE | Admit: 2023-12-02 | Discharge: 2023-12-02 | Disposition: A | Discharge status: Home / Self Care | Attending: Neurosurgery | Admitting: Neurosurgery

## 2023-12-02 ENCOUNTER — Encounter
Admission: RE | Disposition: A | Discharge status: Home / Self Care | Payer: Self-pay | Source: Home / Self Care | Attending: Neurosurgery

## 2023-12-02 DIAGNOSIS — G5603 Carpal tunnel syndrome, bilateral upper limbs: Secondary | ICD-10-CM | POA: Diagnosis present

## 2023-12-02 DIAGNOSIS — Z87891 Personal history of nicotine dependence: Secondary | ICD-10-CM | POA: Diagnosis not present

## 2023-12-02 DIAGNOSIS — Z01812 Encounter for preprocedural laboratory examination: Secondary | ICD-10-CM

## 2023-12-02 DIAGNOSIS — G5601 Carpal tunnel syndrome, right upper limb: Secondary | ICD-10-CM

## 2023-12-02 DIAGNOSIS — Z01818 Encounter for other preprocedural examination: Secondary | ICD-10-CM

## 2023-12-02 HISTORY — PX: CARPAL TUNNEL RELEASE: SHX101

## 2023-12-02 LAB — POCT PREGNANCY, URINE: Preg Test, Ur: NEGATIVE

## 2023-12-02 SURGERY — CARPAL TUNNEL RELEASE
Anesthesia: General | Site: Wrist | Laterality: Right

## 2023-12-02 MED ORDER — DEXAMETHASONE SODIUM PHOSPHATE 10 MG/ML IJ SOLN
INTRAMUSCULAR | Status: DC | PRN
  Administered 2023-12-02: 10 mg via INTRAVENOUS

## 2023-12-02 MED ORDER — PROPOFOL 10 MG/ML IV BOLUS
INTRAVENOUS | Status: AC
  Filled 2023-12-02: qty 20

## 2023-12-02 MED ORDER — HYDROCODONE-ACETAMINOPHEN 5-325 MG PO TABS
1.0000 | ORAL_TABLET | Freq: Four times a day (QID) | ORAL | 0 refills | Status: AC | PRN
  Filled 2023-12-02: qty 12, 3d supply, fill #0

## 2023-12-02 MED ORDER — CEFAZOLIN SODIUM-DEXTROSE 2-4 GM/100ML-% IV SOLN
INTRAVENOUS | Status: AC
  Filled 2023-12-02: qty 100

## 2023-12-02 MED ORDER — LIDOCAINE HCL (PF) 1 % IJ SOLN
INTRAMUSCULAR | Status: AC
  Filled 2023-12-02: qty 30

## 2023-12-02 MED ORDER — LIDOCAINE HCL 1 % IJ SOLN
INTRAMUSCULAR | Status: DC | PRN
Start: 2023-12-02 — End: 2023-12-02
  Administered 2023-12-02: 5 mL

## 2023-12-02 MED ORDER — ONDANSETRON HCL 4 MG/2ML IJ SOLN
INTRAMUSCULAR | Status: DC | PRN
  Administered 2023-12-02: 4 mg via INTRAVENOUS

## 2023-12-02 MED ORDER — ORAL CARE MOUTH RINSE: 15.0000 mL | Freq: Once | OROMUCOSAL | Status: AC

## 2023-12-02 MED ORDER — CHLORHEXIDINE GLUCONATE 0.12 % MT SOLN
15.0000 mL | Freq: Once | OROMUCOSAL | Status: AC
  Administered 2023-12-02: 15 mL via OROMUCOSAL

## 2023-12-02 MED ORDER — OXYCODONE HCL 5 MG PO TABS: 5.0000 mg | ORAL_TABLET | Freq: Once | ORAL | Status: DC | PRN

## 2023-12-02 MED ORDER — FENTANYL CITRATE (PF) 100 MCG/2ML IJ SOLN: 25.0000 ug | INTRAMUSCULAR | Status: DC | PRN

## 2023-12-02 MED ORDER — MIDAZOLAM HCL 2 MG/2ML IJ SOLN
INTRAMUSCULAR | Status: AC
  Filled 2023-12-02: qty 2

## 2023-12-02 MED ORDER — LIDOCAINE HCL (CARDIAC) PF 100 MG/5ML IV SOSY
PREFILLED_SYRINGE | INTRAVENOUS | Status: DC | PRN
  Administered 2023-12-02: 40 mg via INTRAVENOUS

## 2023-12-02 MED ORDER — PROPOFOL 500 MG/50ML IV EMUL
INTRAVENOUS | Status: DC | PRN
Start: 2023-12-02 — End: 2023-12-02
  Administered 2023-12-02: 150 ug/kg/min via INTRAVENOUS

## 2023-12-02 MED ORDER — LACTATED RINGERS IV SOLN: INTRAVENOUS | Status: DC

## 2023-12-02 MED ORDER — 0.9 % SODIUM CHLORIDE (POUR BTL) OPTIME
TOPICAL | Status: DC | PRN
  Administered 2023-12-02: 500 mL

## 2023-12-02 MED ORDER — FENTANYL CITRATE (PF) 100 MCG/2ML IJ SOLN
INTRAMUSCULAR | Status: DC | PRN
  Administered 2023-12-02: 25 ug via INTRAVENOUS
  Administered 2023-12-02: 50 ug via INTRAVENOUS
  Administered 2023-12-02: 25 ug via INTRAVENOUS

## 2023-12-02 MED ORDER — OXYCODONE HCL 5 MG/5ML PO SOLN: 5.0000 mg | Freq: Once | ORAL | Status: DC | PRN

## 2023-12-02 MED ORDER — CEFAZOLIN IN SODIUM CHLORIDE 2-0.9 GM/100ML-% IV SOLN
2.0000 g | Freq: Once | INTRAVENOUS | Status: AC
  Administered 2023-12-02: 2 g via INTRAVENOUS

## 2023-12-02 MED ORDER — FENTANYL CITRATE (PF) 100 MCG/2ML IJ SOLN
INTRAMUSCULAR | Status: AC
Start: 2023-12-02 — End: 2023-12-02
  Filled 2023-12-02: qty 2

## 2023-12-02 MED ORDER — MIDAZOLAM HCL 2 MG/2ML IJ SOLN
INTRAMUSCULAR | Status: DC | PRN
  Administered 2023-12-02: 2 mg via INTRAVENOUS

## 2023-12-02 SURGICAL SUPPLY — 21 items
BNDG ADH 1X3 SHEER STRL LF (GAUZE/BANDAGES/DRESSINGS) ×1 IMPLANT
BNDG ADH 2 X3.75 FABRIC TAN LF (GAUZE/BANDAGES/DRESSINGS) IMPLANT
BRUSH SCRUB EZ 4% CHG (MISCELLANEOUS) ×1 IMPLANT
CHLORAPREP W/TINT 26 (MISCELLANEOUS) ×1 IMPLANT
COVER PROBE FLX POLY STRL (MISCELLANEOUS) ×1 IMPLANT
DERMABOND ADVANCED .7 DNX12 (GAUZE/BANDAGES/DRESSINGS) ×1 IMPLANT
DRAPE EXTREMITY 106X87X128.5 (DRAPES) ×1 IMPLANT
DRAPE IMP U-DRAPE 54X76 (DRAPES) ×1 IMPLANT
DRAPE SHEET LG 3/4 BI-LAMINATE (DRAPES) ×1 IMPLANT
GLOVE BIOGEL PI IND STRL 8 (GLOVE) ×1 IMPLANT
GLOVE SRG 8 PF TXTR STRL LF DI (GLOVE) ×1 IMPLANT
GLOVE SURG SYN 7.5 E (GLOVE) ×1 IMPLANT
GLOVE SURG SYN 7.5 PF PI (GLOVE) ×1 IMPLANT
GOWN SRG XL LVL 3 NONREINFORCE (GOWNS) ×1 IMPLANT
KIT TURNOVER KIT A (KITS) ×1 IMPLANT
NDL HYPO 25X1 1.5 SAFETY (NEEDLE) ×1 IMPLANT
NEEDLE HYPO 25X1 1.5 SAFETY (NEEDLE) ×1 IMPLANT
NS IRRIG 500ML POUR BTL (IV SOLUTION) ×1 IMPLANT
PACK BASIN MINOR ARMC (MISCELLANEOUS) ×1 IMPLANT
STOCKINETTE IMPERVIOUS 9X36 MD (GAUZE/BANDAGES/DRESSINGS) ×1 IMPLANT
ULTRAGLIDE CTR (BLADE) ×1 IMPLANT

## 2023-12-02 NOTE — Discharge Summary (Signed)
 Discharge Summary  Patient ID: Tonya Ruiz MRN: [id number] DOB/AGE: Mar 01, 1971 53 y.o.  Admit date: 12/02/2023 Discharge date: 12/02/2023  Admission Diagnoses: G56.01 Carpal tunnel syndrome on right  Discharge Diagnoses:  Active Problems:   * No active hospital problems. *   Discharged Condition: good  Hospital Course:  Tonya Ruiz is a 53 y.o presenting with right carpal tunnel syndrome s/p release. Her intraoperative course was uncomplicated. She was monitored in PACU and discharged home with Norco to take as needed for pain.   Consults: None  Significant Diagnostic Studies: NA  Treatments: surgery: as above. Please see separately dictated operative report for further details.   Discharge Exam: Blood pressure 123/83, pulse 71, temperature 98.6 F (37 C), temperature source Temporal, resp. rate 14, height 5' 2 (1.575 m), weight 65.8 kg, last menstrual period 03/28/2023, SpO2 99%. CN grossly intact MAEW  Incision d.c.i  Disposition: Discharge disposition: 01-Home or Self Care        Allergies as of 12/02/2023       Reactions   Naproxen Sodium [naproxen] Other (See Comments)   indigestion        Medication List     TAKE these medications    Cholecalciferol 1.25 MG (50000 UT) Tabs Take 1 tablet by mouth daily.   HYDROcodone -acetaminophen  5-325 MG tablet Commonly known as: NORCO/VICODIN Take 1 tablet by mouth every 6 (six) hours as needed for up to 3 days for severe pain (pain score 7-10).   Multi + Omega-3 Adult Gummies Chew Chew 2 each by mouth in the morning. Nature Made Multi for Her + Omega-3 Gummies         Signed: Edsel Jama Goods 12/02/2023, 10:33 AM

## 2023-12-02 NOTE — Discharge Instructions (Addendum)
 NEUROSURGERY DISCHARGE INSTRUCTIONS  Admission diagnosis: Carpal tunnel syndrome on right [G56.01]  Operative procedure: Carpal tunnel release  What to do after you leave the hospital:  Recommended diet: regular diet. Increase protein intake to promote wound healing.  Recommended activity: activity as tolerated. Avoid using operative arm for 3 days    Special Instructions  No straining, no heavy lifting > 10lbs x 4 weeks.  Keep incision area clean and dry. May shower in 2 days. No baths or pools for 6 weeks.  Please remove dressing tomorrow, no need to apply a bandage afterwards  Avoid using operative arm for 3 days after surgery. Avoid lifting objects heavier than 10 pounds for the first 6 weeks after surgery. Where possible, avoid household activities that involve lifting, bending, reaching, pushing, or pulling such as laundry, vacuuming, grocery shopping, and childcare. Try to arrange for help from friends and family for these activities while you heal. Do not drive while taking prescription pain medication. Weeks 6 through 12 after surgery: avoid lifting more than 25 pounds.   You have no sutures to remove, the skin is closed with adhesive  Please take pain medications as directed. Take a stool softener if on pain medications  *Regarding compression stockings-  Please wear day and night until you are walking a couple hundred feet three times a day.   Please Report any of the following: Nausea or Vomiting, Temperature is greater than 101.51F (38.1C) degrees, Dizziness, Abdominal Pain, Difficulty Breathing or Shortness of Breath, Inability to Eat, drink Fluids, or Take medications, Bleeding, swelling, or drainage from surgical incision sites, New numbness or weakness, and Bowel or bladder dysfunction to the neurosurgeon on call. How to contact us :  If you have any questions/concerns before or after surgery, you can reach us  at [phone], or you can send a mychart message. We can be  reached by phone or mychart 8am-4pm, Monday-Friday.  *Please note: Calls after 4pm are forwarded to a third party answering service. Mychart messages are not routinely monitored during evenings, weekends, and holidays. Please call our office to contact the answering service for urgent concerns during non-business hours.   Additional Follow up appointments Please follow up with Lyle Decamp PA-C as scheduled in 2-3 weeks   Please see below for scheduled appointments:  Future Appointments  Date Time Provider Department Center  12/16/2023  9:00 AM Decamp Lyle, PA-C [facility] None  01/05/2024  9:30 AM Claudene Penne ORN, MD [facility] None

## 2023-12-02 NOTE — Anesthesia Postprocedure Evaluation (Signed)
 Anesthesia Post Note  Patient: Tonya Ruiz  Procedure(s) Performed: CARPAL TUNNEL RELEASE (Right: Wrist)  Patient location during evaluation: PACU Anesthesia Type: General Level of consciousness: awake and alert Pain management: pain level controlled Vital Signs Assessment: post-procedure vital signs reviewed and stable Respiratory status: spontaneous breathing, nonlabored ventilation, respiratory function stable and patient connected to nasal cannula oxygen Cardiovascular status: blood pressure returned to baseline and stable Postop Assessment: no apparent nausea or vomiting Anesthetic complications: no   No notable events documented.   Last Vitals:  Vitals:   12/02/23 0844 12/02/23 1035  BP: 123/83 94/62  Pulse: 71 64  Resp: 14 18  Temp: 37 C (!) 36.4 C  SpO2: 99% 97%    Last Pain:  Vitals:   12/02/23 0844  TempSrc: Temporal  PainSc: 0-No pain                 Debby Mines

## 2023-12-02 NOTE — Transfer of Care (Signed)
 Immediate Anesthesia Transfer of Care Note  Patient: Tonya Ruiz  Procedure(s) Performed: CARPAL TUNNEL RELEASE (Right: Wrist)  Patient Location: PACU  Anesthesia Type:MAC  Level of Consciousness: awake  Airway & Oxygen Therapy: Patient Spontanous Breathing  Post-op Assessment: Report given to RN and Post -op Vital signs reviewed and stable  Post vital signs: Reviewed and stable  Last Vitals:  Vitals Value Taken Time  BP 94/62 12/02/23 10:35  Temp    Pulse 74 12/02/23 10:37  Resp 14 12/02/23 10:37  SpO2 99 % 12/02/23 10:37  Vitals shown include unfiled device data.  Last Pain:  Vitals:   12/02/23 0844  TempSrc: Temporal  PainSc: 0-No pain         Complications: No notable events documented.

## 2023-12-02 NOTE — Interval H&P Note (Signed)
 History and Physical Interval Note:  12/02/2023 9:55 AM  Tonya Ruiz  has presented today for surgery, with the diagnosis of G56.01 Carpal tunnel syndrome on right.  The various methods of treatment have been discussed with the patient and family. After consideration of risks, benefits and other options for treatment, the patient has consented to  Procedure(s) with comments: CARPAL TUNNEL RELEASE (Right) - RIGHT ULTRASOUND GUIDED CARPAL TUNNEL RELEASE as a surgical intervention.  The patient's history has been reviewed, patient examined, no change in status, stable for surgery.  I have reviewed the patient's chart and labs.  Questions were answered to the patient's satisfaction.    Heart and lungs clear    [name]

## 2023-12-02 NOTE — Anesthesia Preprocedure Evaluation (Signed)
 Anesthesia Evaluation  Patient identified by MRN, date of birth, ID band Patient awake    Reviewed: Allergy & Precautions, H&P , NPO status , Patient's Chart, lab work & pertinent test results, reviewed documented beta blocker date and time   History of Anesthesia Complications Negative for: history of anesthetic complications  Airway Mallampati: I  TM Distance: >3 FB Neck ROM: full    Dental  (+) Dental Advidsory Given, Caps, Teeth Intact, Missing   Pulmonary neg pulmonary ROS, former smoker   Pulmonary exam normal breath sounds clear to auscultation       Cardiovascular Exercise Tolerance: Good negative cardio ROS Normal cardiovascular exam Rhythm:regular Rate:Normal     Neuro/Psych negative neurological ROS  negative psych ROS   GI/Hepatic negative GI ROS, Neg liver ROS,,,  Endo/Other  negative endocrine ROS    Renal/GU      Musculoskeletal   Abdominal   Peds  Hematology negative hematology ROS (+)   Anesthesia Other Findings Past Medical History: No date: Allergy No date: Arthritis No date: Chicken pox 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   Reproductive/Obstetrics negative OB ROS                              Anesthesia Physical Anesthesia Plan  ASA: 2  Anesthesia Plan: General   Post-op Pain Management: Minimal or no pain anticipated   Induction: Intravenous  PONV Risk Score and Plan: 3 and Propofol  infusion, TIVA and Ondansetron   Airway Management Planned: Nasal Cannula  Additional Equipment: None  Intra-op Plan:   Post-operative Plan:   Informed Consent: I have reviewed the patients History and Physical, chart, labs and discussed the procedure including the risks, benefits and alternatives for the proposed anesthesia with the patient or authorized representative who has indicated his/her understanding and acceptance.     Dental advisory given  Plan Discussed with:  CRNA and Surgeon  Anesthesia Plan Comments: (Discussed risks of anesthesia with patient, including possibility of difficulty with spontaneous ventilation under anesthesia necessitating airway intervention, PONV, and rare risks such as cardiac or respiratory or neurological events, and allergic reactions. Discussed the role of CRNA in patient's perioperative care. Patient understands.)        Anesthesia Quick Evaluation

## 2023-12-03 ENCOUNTER — Encounter: Payer: Self-pay | Admitting: Neurosurgery

## 2023-12-04 ENCOUNTER — Encounter: Admitting: Physical Therapy

## 2023-12-04 NOTE — Op Note (Signed)
 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  Findings: Severe compression of the median nerve at the transverse carpal ligament  Preoperative Diagnosis:G56.01 Carpal tunnel syndrome on right  Postoperative Diagnosis: G56.01 Carpal tunnel syndrome on right   Postoperative Diagnosis: same   EBL: Minimal IVF: See anesthesia report Drains: none Disposition:Stable to PACU Complications: none  No foley catheter was placed.   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  They had tried rest, padding, and watchful waiting but had continued progressive symptoms.  Given the progression of her median neuropathy plan was made for median nerve decompression  Risk of surgery is discussed and include: Infection, bleeding, wound healing issues, pillar pain nerve injury, pain, failure to relieve the symptoms, need for further surgery.  Procedure:  1) right sided carpal tunnel decompression with ultrasound guidance   Procedure: After obtaining informed consent, the patient taken to the operating room, placed in supine position, monitored anesthesia care was induced.  They were given preoperative antibiotics.  Prepped and draped in the usual fashion.  Comprehensive timeout was performed verifying the patient's name, MRN, planned procedure.  An ultrasound was used with a sterile probe.  We used this to mark out our safety points including the interval between the ulnar artery and median nerve.  We identified the motor branch as well as the first sensory branch which were both in safe position.  We also identified the vascular arcade which was in safe positioning as well.  At this point we placed a block with Marcaine without epinephrine .  We blocked the skin where the incision would be, the superficial sensory median nerve, as  well as the transverse carpal ligament.  Under ultrasound guidance we utilized the local anesthetic to perform a hydrodissection of the nerve from the transverse carpal ligament.  We then prepped the sonexs ultra CTR knife on the back table while the anesthetic set in.  We performed a small linear incision approximately 2 to 3 mm.  We then utilized a Statistician under ultrasound guidance to identify the underside of the transverse carpal ligament.  The fat and connective tissue was dissected off the underside.  We could feel that this was quite thickened and calcified.  Causing severe compression.  Once we had a clear tract we then placed the ultrasound-guided knife into the incision and advanced it through the carpal tunnel.  We verified the safety zones including the median nerve which did not significantly cross over the knife.  We are able to see the first sensory branch which was not crossing the knife.  The artery was also in a safe place.  At this point we divided the transverse carpal ligament under ultrasound guidance, to get a full release it took 2 passes.  The nerve relaxed laterally and was well decompressed.  After the 2 passes we placed a [place] 4 into the wound and were able to feel a complete dissection of the transverse carpal ligament.  We then irrigated, we got meticulous hemostasis.  Skin glue was placed on the incision and a Band-Aid was placed on top once this was dried.  No immediate complications.  Sponge and pattie counts were correct at the end of the procedure.   I performed the procedure without an assistant surgeon  Carroll Clamp, MD/MSCR

## 2023-12-09 ENCOUNTER — Encounter: Admitting: Physical Therapy

## 2023-12-11 ENCOUNTER — Encounter: Admitting: Physical Therapy

## 2023-12-16 ENCOUNTER — Encounter: Payer: Self-pay | Admitting: Physician Assistant

## 2023-12-16 ENCOUNTER — Ambulatory Visit (INDEPENDENT_AMBULATORY_CARE_PROVIDER_SITE_OTHER): Admitting: Physician Assistant

## 2023-12-16 VITALS — BP 122/74 | Temp 98.1°F | Ht 62.0 in | Wt 145.0 lb

## 2023-12-16 DIAGNOSIS — G5601 Carpal tunnel syndrome, right upper limb: Secondary | ICD-10-CM

## 2023-12-16 DIAGNOSIS — R200 Anesthesia of skin: Secondary | ICD-10-CM

## 2023-12-16 DIAGNOSIS — Z09 Encounter for follow-up examination after completed treatment for conditions other than malignant neoplasm: Secondary | ICD-10-CM

## 2023-12-16 NOTE — Progress Notes (Signed)
   REFERRING PHYSICIAN:  Corwin Antu, Fnp [address] [place],  [place] [address]  DOS: 12/02/2023, right carpal tunnel release  HISTORY OF PRESENT ILLNESS: Gerlene Glassburn is 2 weeks status post right carpal tunnel release.. Overall, she is doing very well.  Her symptoms are gone in her right hand.  She no longer has numbness and tingling in her hand.    PHYSICAL EXAMINATION:  NEUROLOGICAL:  General: In no acute distress.   Awake, alert, oriented to person, place, and time.  Pupils equal round and reactive to light.  Facial tone is symmetric.    Strength:  Worse on the bilateral strength compared to before the surgery but the  Incision c/d/I  Imaging:  No imaging for this appointment.  Assessment / Plan: Jessicah Croll is doing well after right ultrasound-guided carpal tunnel release 2 weeks ago.  She is doing very well.  She inquired about potentially having her left hand completed as she has known carpal tunnel syndrome on her contralateral side, but we will wait for now.    Advised to contact the office if any questions or concerns arise.   Lyle Decamp PA-C Dept of Neurosurgery

## 2024-01-05 ENCOUNTER — Encounter: Admitting: Neurosurgery

## 2024-03-01 ENCOUNTER — Ambulatory Visit: Admitting: Neurosurgery

## 2024-03-03 ENCOUNTER — Ambulatory Visit: Admitting: Neurosurgery

## 2024-03-03 ENCOUNTER — Other Ambulatory Visit: Payer: Self-pay

## 2024-03-03 ENCOUNTER — Encounter: Payer: Self-pay | Admitting: Neurosurgery

## 2024-03-03 ENCOUNTER — Ambulatory Visit: Payer: Self-pay | Admitting: Neurosurgery

## 2024-03-03 VITALS — BP 110/78 | Ht 62.0 in | Wt 152.8 lb

## 2024-03-03 DIAGNOSIS — G5602 Carpal tunnel syndrome, left upper limb: Secondary | ICD-10-CM | POA: Diagnosis not present

## 2024-03-03 DIAGNOSIS — Z01818 Encounter for other preprocedural examination: Secondary | ICD-10-CM

## 2024-03-03 NOTE — Progress Notes (Signed)
   REFERRING PHYSICIAN:  Corwin Antu, Fnp [address] [place],  [place] [address]  DOS: 12/02/2023, right carpal tunnel release  HISTORY OF PRESENT ILLNESS: Tonya Ruiz is approximately 3 months out from her right sided carpal tunnel release.  Overall she states that she has done very well had minimal symptoms.  No longer getting any numbness and tingling in her right hand.  Continues to have left-sided symptoms.  She has been bracing for approximately 2 months and not having any improvement.  She has had the symptoms for approximately 6 months.  They wake her from sleep at least 2-3 times per night.  She continues to have worsening.  She feels like she is failing her conservative care.  PHYSICAL EXAMINATION:  NEUROLOGICAL:  General: In no acute distress.   Awake, alert, oriented to person, place, and time.  Pupils equal round and reactive to light.  Facial tone is symmetric.    Strength: Tinel and carpal compression test positive.  Phalen's positive.  Incision c/d/I  Imaging:  No imaging for this appointment.  EMG with bilateral carpal tunnel, right worse than left.  Previously documented.  Slowing across the wrist.  Assessment / Plan: Tonya Ruiz is doing very well after right sided carpal tunnel release.  This was done approximately 3 months ago.  She states that she has had a massive improvement in her symptoms.  She continues to have difficulty with the left side.  She has known left-sided carpal tunnel syndrome which is electrodiagnostically mild.  She has been dealing with the symptoms for approximately 6 months with worsening over the past 2 months.  She has been doing nightly brace therapy but has not had any significant improvement.  She does not want to go forward with any injections and would rather have a decompression procedure.  Since she has had symptoms for greater than 6 months, has had progressive symptoms in the spite of conservative management including nighttime  wrist bracing and continues to get nightly symptoms waking her at least 2-3 times at night this is interfering with her quality of life and her ability to rest after her full-time work.  She would like to go forward with a left-sided carpal tunnel release with ultrasound guidance.  We discussed the risks benefits and alternatives to management.  She would like to go forward.  We discussed nerve injury, failure to improve symptoms, need for further surgery, incomplete decompression, and pillar pain.    Advised to contact the office if any questions or concerns arise.   Penne MICAEL Sharps, MD Dept of Neurosurgery

## 2024-03-03 NOTE — Patient Instructions (Signed)
 Please see below for information in regards to your upcoming surgery:   Planned surgery: Left sided carpal tunnel release with ultrasound guidance   Surgery date: 04/20/2024 at [facility] (Medical Mall: [address], [place], [place] [address]) - you will find out your arrival time the business day before your surgery.   Pre-op appointment at [facility] Pre-admit Testing: you will receive a call with a date/time for this appointment. If you are scheduled for an in person appointment, Pre-admit Testing is located on the first floor of the Medical Arts building, [address] [facility], [address]. During this appointment, they will advise you which medications you can take the morning of surgery, and which medications you will need to hold for surgery. Labs (such as blood work, EKG) may be done at your pre-op appointment. You are not required to fast for these labs. Should you need to change your pre-op appointment, please call Pre-admit testing at [phone].      How to contact us :  If you have any questions/concerns before or after surgery, you can reach us  at [phone], or you can send a mychart message. We can be reached by phone or mychart 8am-4pm, Monday-Friday.  *Please note: Calls after 4pm are forwarded to a third party answering service. Mychart messages are not routinely monitored during evenings, weekends, and holidays. Please call our office to contact the answering service for urgent concerns during non-business hours.    If you have FMLA/disability paperwork, please drop it off or fax it to [phone]   Appointments/FMLA & disability paperwork: Reche Hait, & Nichole Registered Nurse/Surgery scheduler: Kendelyn, RN & Katie, RN Certified Medical Assistants: Don, CMA, Elenor, CMA, Damien, CMA, & Auston, [place] Physician Assistants: Lyle Decamp, PA-C, Edsel Goods, PA-C & Glade Boys, PA-C Surgeons: Penne Sharps, MD & Reeves Daisy, MD   [facility] PREADMIT TESTING VISIT and SURGERY INFORMATION SHEET   Now that surgery has been scheduled you can anticipate several phone calls from [facility] services. A pharmacy technician will call you to verify your current list of medications taken at home.               The Pre-Service Center will call to verify your insurance information and to give you billing estimates and information.             The Preadmit Testing Office will be calling to schedule a visit to obtain information for the anesthesia team and provide instructions on preparation for surgery.  What can you expect for the Preadmit Testing Visit: Appointments may be scheduled in-person or by telephone.  If a telephone visit is scheduled, you may be asked to come into the office to have lab tests or other studies performed.   This visit will not be completed any greater than 14 days prior to your surgery.  If your surgery has been scheduled for a future date, please do not be alarmed if we have not contacted you to schedule an appointment more than a month prior to the surgery date.    Please be prepared to provide the following information during this appointment:            -Personal medical history                                               -  Medication and allergy list            -Any history of problems with anesthesia              -Recent lab work or diagnostic studies            -Please notify us  of any needs we should be aware of to provide the best care possible           -You will be provided with instructions on how to prepare for your surgery.    On The Day of Surgery:  You must have a driver to take you home after surgery, you will be asked not to drive for 24 hours following surgery.  Taxi, Gisele and non-medical transport will not be acceptable means of transportation unless you have a responsible individual who will be traveling with you.  Visitors in the surgical  area:   2 people will be able to visit you in your room once your preparation for surgery has been completed. During surgery, your visitors will be asked to wait in the Surgery Waiting Area.  It is not a requirement for them to stay, if they prefer to leave and come back.  Your visitor(s) will be given an update once the surgery has been completed.  No visitors are allowed in the initial recovery room to respect patient privacy and safety.  Once you are more awake and transfer to the secondary recovery area, or are transferred to an inpatient room, visitors will again be able to see you.  To respect and protect your privacy: We will ask on the day of surgery who your driver will be and what the contact number for that individual will be. We will ask if it is okay to share information with this individual, or if there is an alternative individual that we, or the surgeon, should contact to provide updates and information. If family or friends come to the surgical information desk requesting information about you, who you have not listed with us , no information will be given.   It may be helpful to designate someone as the main contact who will be responsible for updating your other friends and family.    PREADMIT TESTING OFFICE: [phone] SAME DAY SURGERY: [phone] We look forward to caring for you before and throughout the process of your surgery.

## 2024-04-09 ENCOUNTER — Encounter
Admission: RE | Admit: 2024-04-09 | Discharge: 2024-04-09 | Disposition: A | Discharge status: Home / Self Care | Source: Ambulatory Visit | Attending: Neurosurgery | Admitting: Neurosurgery

## 2024-04-09 ENCOUNTER — Other Ambulatory Visit: Payer: Self-pay

## 2024-04-09 VITALS — Wt 153.0 lb

## 2024-04-09 DIAGNOSIS — Z01818 Encounter for other preprocedural examination: Secondary | ICD-10-CM

## 2024-04-09 HISTORY — DX: Personal history of nicotine dependence: Z87.891

## 2024-04-09 HISTORY — DX: Carpal tunnel syndrome, left upper limb: G56.02

## 2024-04-09 NOTE — Patient Instructions (Signed)
 Your procedure is scheduled on:04-20-24 Tuesday Report to the Registration Desk on the 1st floor of the Medical Mall.Then proceed to the 2nd floor Surgery Desk To find out your arrival time, please call [phone] between 1PM - 3PM on:04-19-24 Monday If your arrival time is 6:00 am, do not arrive before that time as the Medical Mall entrance doors do not open until 6:00 am.  REMEMBER: Instructions that are not followed completely may result in serious medical risk, up to and including death; or upon the discretion of your surgeon and anesthesiologist your surgery may need to be rescheduled.  Do not eat food after midnight the night before surgery.  No gum chewing or hard candies.  You may however, drink CLEAR liquids up to 2 hours before you are scheduled to arrive for your surgery. Do not drink anything within 2 hours of your scheduled arrival time.  Clear liquids include: - water  - apple juice without pulp - gatorade (not RED colors) - black coffee or tea (Do NOT add milk or creamers to the coffee or tea) Do NOT drink anything that is not on this list.  One week prior to surgery:Last dose will be on 04-12-24 Monday Stop ANY OVER THE COUNTER supplements until after surgery (Vitamin D-3, Multivitamin, Turmeric)  Do NOT take any medication the day of surgery  No Alcohol for 24 hours before or after surgery.  No Smoking including e-cigarettes for 24 hours before surgery.  No chewable tobacco products for at least 6 hours before surgery.  No nicotine patches on the day of surgery.  Do not use any recreational drugs for at least a week (preferably 2 weeks) before your surgery.  Please be advised that the combination of cocaine and anesthesia may have negative outcomes, up to and including death. If you test positive for cocaine, your surgery will be cancelled.  On the morning of surgery brush your teeth with toothpaste and water, you may rinse your mouth with mouthwash if you  wish. Do not swallow any toothpaste or mouthwash.  Use CHG Soap as directed on instruction sheet.  Do not wear jewelry, make-up, hairpins, clips or nail polish.  For welded (permanent) jewelry: bracelets, anklets, waist bands, etc.  Please have this removed prior to surgery.  If it is not removed, there is a chance that hospital personnel will need to cut it off on the day of surgery.  Do not wear lotions, powders, or perfumes.   Do not shave body hair from the neck down 48 hours before surgery.  Contact lenses, hearing aids and dentures may not be worn into surgery.  Do not bring valuables to the hospital. [facility] is not responsible for any missing/lost belongings or valuables.    Notify your doctor if there is any change in your medical condition (cold, fever, infection).  Wear comfortable clothing (specific to your surgery type) to the hospital.  After surgery, you can help prevent lung complications by doing breathing exercises.  Take deep breaths and cough every 1-2 hours. Your doctor may order a device called an Incentive Spirometer to help you take deep breaths. When coughing or sneezing, hold a pillow firmly against your incision with both hands. This is called "splinting." Doing this helps protect your incision. It also decreases belly discomfort.  If you are being admitted to the hospital overnight, leave your suitcase in the car. After surgery it may be brought to your room.  In case of increased patient census, it may be  necessary for you, the patient, to continue your postoperative care in the Same Day Surgery department.  If you are being discharged the day of surgery, you will not be allowed to drive home. You will need a responsible individual to drive you home and stay with you for 24 hours after surgery.   If you are taking public transportation, you will need to have a responsible individual with you.  Please call the Pre-admissions Testing Dept. at [phone] if you have any questions about these instructions.  Surgery Visitation Policy:  Patients having surgery or a procedure may have two visitors.  Children under the age of 45 must have an adult with them who is not the patient.                                                                                                             Preparing for Surgery with CHLORHEXIDINE  GLUCONATE (CHG) Soap  Chlorhexidine  Gluconate (CHG) Soap  o An antiseptic cleaner that kills germs and bonds with the skin to continue killing germs even after washing  o Used for showering the night before surgery and morning of surgery  Before surgery, you can play an important role by reducing the number of germs on your skin.  CHG (Chlorhexidine  gluconate) soap is an antiseptic cleanser which kills germs and bonds with the skin to continue killing germs even after washing.  Please do not use if you have an allergy to CHG or antibacterial soaps. If your skin becomes reddened/irritated stop using the CHG.  1. Shower the NIGHT BEFORE SURGERY with CHG soap.  2. If you choose to wash your hair, wash your hair first as usual with your normal shampoo.  3. After shampooing, rinse your hair and body thoroughly to remove the shampoo.  4. Use CHG as you would any other liquid soap. You can apply CHG directly to the skin and wash gently with a clean washcloth.  5. Apply the CHG soap to your body only from the neck down. Do not use on open wounds or open sores. Avoid contact with your eyes, ears, mouth, and genitals (private parts). Wash face and genitals (private parts) with your normal soap.  6. Wash thoroughly, paying special attention to the area where your surgery will be performed.  7. Thoroughly rinse your body with warm water.  8. Do not shower/wash with your normal soap after using and rinsing off the CHG soap.  9. Do not use lotions, oils, etc., after showering with CHG.  10. Pat yourself dry with  a clean towel.  11. Wear clean pajamas to bed the night before surgery.  12. Place clean sheets on your bed the night of your shower and do not sleep with pets.  13. Do not apply any deodorants/lotions/powders.  14. Please wear clean clothes to the hospital.  15. Remember to brush your teeth with your regular toothpaste.   Merchandiser, Retail to address health-related social needs:  [email or web address]

## 2024-04-20 ENCOUNTER — Other Ambulatory Visit: Payer: Self-pay

## 2024-04-20 ENCOUNTER — Encounter: Payer: Self-pay | Admitting: Neurosurgery

## 2024-04-20 ENCOUNTER — Encounter
Admission: RE | Disposition: A | Discharge status: Home / Self Care | Payer: Self-pay | Source: Home / Self Care | Attending: Neurosurgery

## 2024-04-20 ENCOUNTER — Ambulatory Visit: Admitting: Certified Registered Nurse Anesthetist

## 2024-04-20 ENCOUNTER — Ambulatory Visit
Admission: RE | Admit: 2024-04-20 | Discharge: 2024-04-20 | Disposition: A | Discharge status: Home / Self Care | Attending: Neurosurgery | Admitting: Neurosurgery

## 2024-04-20 DIAGNOSIS — Z87891 Personal history of nicotine dependence: Secondary | ICD-10-CM | POA: Diagnosis not present

## 2024-04-20 DIAGNOSIS — Z01818 Encounter for other preprocedural examination: Secondary | ICD-10-CM

## 2024-04-20 DIAGNOSIS — G5602 Carpal tunnel syndrome, left upper limb: Secondary | ICD-10-CM | POA: Diagnosis not present

## 2024-04-20 HISTORY — PX: CARPAL TUNNEL RELEASE: SHX101

## 2024-04-20 LAB — POCT PREGNANCY, URINE: Preg Test, Ur: NEGATIVE

## 2024-04-20 SURGERY — CARPAL TUNNEL RELEASE
Anesthesia: General | Site: Wrist | Laterality: Left

## 2024-04-20 MED ORDER — LIDOCAINE HCL (PF) 1 % IJ SOLN
INTRAMUSCULAR | Status: AC
  Filled 2024-04-20: qty 60

## 2024-04-20 MED ORDER — PROPOFOL 1000 MG/100ML IV EMUL
INTRAVENOUS | Status: AC
  Filled 2024-04-20: qty 100

## 2024-04-20 MED ORDER — LACTATED RINGERS IV SOLN: INTRAVENOUS | Status: DC

## 2024-04-20 MED ORDER — OXYCODONE HCL 5 MG/5ML PO SOLN: 5.0000 mg | Freq: Once | ORAL | Status: DC | PRN

## 2024-04-20 MED ORDER — DEXAMETHASONE SOD PHOSPHATE PF 10 MG/ML IJ SOLN
INTRAMUSCULAR | Status: DC | PRN
  Administered 2024-04-20: 10 mg via INTRAVENOUS

## 2024-04-20 MED ORDER — LACTATED RINGERS IV SOLN: INTRAVENOUS | Status: DC | PRN

## 2024-04-20 MED ORDER — FENTANYL CITRATE (PF) 100 MCG/2ML IJ SOLN
INTRAMUSCULAR | Status: DC | PRN
  Administered 2024-04-20 (×2): 50 ug via INTRAVENOUS

## 2024-04-20 MED ORDER — MIDAZOLAM HCL 2 MG/2ML IJ SOLN
INTRAMUSCULAR | Status: AC
  Filled 2024-04-20: qty 2

## 2024-04-20 MED ORDER — BUPIVACAINE-EPINEPHRINE (PF) 0.5% -1:200000 IJ SOLN
INTRAMUSCULAR | Status: AC
  Filled 2024-04-20: qty 40

## 2024-04-20 MED ORDER — DOCUSATE SODIUM 100 MG PO CAPS
100.0000 mg | ORAL_CAPSULE | Freq: Two times a day (BID) | ORAL | 0 refills | Status: AC
  Filled 2024-04-20: qty 20, 10d supply, fill #0

## 2024-04-20 MED ORDER — FENTANYL CITRATE (PF) 100 MCG/2ML IJ SOLN: 25.0000 ug | INTRAMUSCULAR | Status: DC | PRN

## 2024-04-20 MED ORDER — ACETAMINOPHEN 10 MG/ML IV SOLN
INTRAVENOUS | Status: AC
  Filled 2024-04-20: qty 100

## 2024-04-20 MED ORDER — FENTANYL CITRATE (PF) 100 MCG/2ML IJ SOLN
INTRAMUSCULAR | Status: AC
  Filled 2024-04-20: qty 2

## 2024-04-20 MED ORDER — SENNA 8.6 MG PO TABS
1.0000 | ORAL_TABLET | Freq: Every day | ORAL | 0 refills | Status: DC
  Filled 2024-04-20: qty 10, 10d supply, fill #0

## 2024-04-20 MED ORDER — CHLORHEXIDINE GLUCONATE 0.12 % MT SOLN
OROMUCOSAL | Status: AC
  Filled 2024-04-20: qty 15

## 2024-04-20 MED ORDER — MIDAZOLAM HCL (PF) 2 MG/2ML IJ SOLN
INTRAMUSCULAR | Status: DC | PRN
  Administered 2024-04-20: 2 mg via INTRAVENOUS

## 2024-04-20 MED ORDER — PROPOFOL 500 MG/50ML IV EMUL
INTRAVENOUS | Status: DC | PRN
  Administered 2024-04-20: 150 ug/kg/min via INTRAVENOUS

## 2024-04-20 MED ORDER — PROPOFOL 10 MG/ML IV BOLUS
INTRAVENOUS | Status: AC
Start: 2024-04-20 — End: 2024-04-20
  Filled 2024-04-20: qty 20

## 2024-04-20 MED ORDER — 0.9 % SODIUM CHLORIDE (POUR BTL) OPTIME
TOPICAL | Status: DC | PRN
  Administered 2024-04-20: 500 mL

## 2024-04-20 MED ORDER — OXYCODONE HCL 5 MG PO TABS: 5.0000 mg | ORAL_TABLET | Freq: Once | ORAL | Status: DC | PRN

## 2024-04-20 MED ORDER — ONDANSETRON HCL 4 MG/2ML IJ SOLN
INTRAMUSCULAR | Status: DC | PRN
  Administered 2024-04-20: 4 mg via INTRAVENOUS

## 2024-04-20 MED ORDER — LIDOCAINE HCL 1 % IJ SOLN
INTRAMUSCULAR | Status: DC | PRN
  Administered 2024-04-20: 3 mL

## 2024-04-20 MED ORDER — CEFAZOLIN SODIUM-DEXTROSE 2-4 GM/100ML-% IV SOLN
INTRAVENOUS | Status: AC
  Filled 2024-04-20: qty 100

## 2024-04-20 MED ORDER — GLYCOPYRROLATE 0.2 MG/ML IJ SOLN
INTRAMUSCULAR | Status: DC | PRN
  Administered 2024-04-20: .1 mg via INTRAVENOUS

## 2024-04-20 MED ORDER — HYDROCODONE-ACETAMINOPHEN 5-325 MG PO TABS
1.0000 | ORAL_TABLET | Freq: Four times a day (QID) | ORAL | 0 refills | Status: AC | PRN
Start: 2024-04-20 — End: 2024-04-27
  Filled 2024-04-20: qty 28, 7d supply, fill #0

## 2024-04-20 MED ORDER — CEFAZOLIN SODIUM-DEXTROSE 2-4 GM/100ML-% IV SOLN
2.0000 g | Freq: Once | INTRAVENOUS | Status: AC
  Administered 2024-04-20: 2 g via INTRAVENOUS

## 2024-04-20 MED ORDER — CHLORHEXIDINE GLUCONATE 0.12 % MT SOLN
15.0000 mL | Freq: Once | OROMUCOSAL | Status: AC
Start: 2024-04-20 — End: 2024-04-20
  Administered 2024-04-20: 15 mL via OROMUCOSAL

## 2024-04-20 MED ORDER — ORAL CARE MOUTH RINSE: 15.0000 mL | Freq: Once | OROMUCOSAL | Status: AC

## 2024-04-20 SURGICAL SUPPLY — 19 items
BNDG ADH 1X3 SHEER STRL LF (GAUZE/BANDAGES/DRESSINGS) ×1 IMPLANT
BNDG ADH 2 X3.75 FABRIC TAN LF (GAUZE/BANDAGES/DRESSINGS) IMPLANT
BRUSH SCRUB EZ 4% CHG (MISCELLANEOUS) ×1 IMPLANT
CHLORAPREP W/TINT 26 (MISCELLANEOUS) ×1 IMPLANT
COVER PROBE FLX POLY STRL (MISCELLANEOUS) ×1 IMPLANT
DERMABOND ADVANCED .7 DNX12 (GAUZE/BANDAGES/DRESSINGS) ×1 IMPLANT
DRAPE EXTREMITY 106X87X128.5 (DRAPES) ×1 IMPLANT
DRAPE IMP U-DRAPE 54X76 (DRAPES) ×1 IMPLANT
DRAPE SHEET LG 3/4 BI-LAMINATE (DRAPES) ×1 IMPLANT
GLOVE BIOGEL PI IND STRL 8 (GLOVE) ×1 IMPLANT
GLOVE SRG 8 PF TXTR STRL LF DI (GLOVE) ×1 IMPLANT
GLOVE SURG SYN 7.5 PF PI (GLOVE) ×1 IMPLANT
GOWN SRG XL LVL 3 NONREINFORCE (GOWNS) ×1 IMPLANT
KIT TURNOVER KIT A (KITS) ×1 IMPLANT
NDL HYPO 25X1 1.5 SAFETY (NEEDLE) ×1 IMPLANT
NS IRRIG 500ML POUR BTL (IV SOLUTION) ×1 IMPLANT
PACK BASIN MINOR ARMC (MISCELLANEOUS) ×1 IMPLANT
STOCKINETTE IMPERVIOUS 9X36 MD (GAUZE/BANDAGES/DRESSINGS) ×1 IMPLANT
ULTRAGLIDE CTR (BLADE) ×1 IMPLANT

## 2024-04-20 NOTE — Anesthesia Postprocedure Evaluation (Signed)
 Anesthesia Post Note  Patient: Tonya Ruiz  Procedure(s) Performed: Left-sided carpal tunnel release with ultrasound guidance (Left: Wrist)  Patient location during evaluation: PACU Anesthesia Type: General Level of consciousness: awake and alert Pain management: pain level controlled Vital Signs Assessment: post-procedure vital signs reviewed and stable Respiratory status: spontaneous breathing, nonlabored ventilation, respiratory function stable and patient connected to nasal cannula oxygen Cardiovascular status: blood pressure returned to baseline and stable Postop Assessment: no apparent nausea or vomiting Anesthetic complications: no   No notable events documented.   Last Vitals:  Vitals:   04/20/24 0800 04/20/24 0811  BP: (!) 108/90 116/68  Pulse: 62 (!) 55  Resp: 14 16  Temp: (!) 36.1 C (!) 36.3 C  SpO2: 99% 100%    Last Pain:  Vitals:   04/20/24 0811  TempSrc: Temporal  PainSc: 0-No pain                 Debby Mines

## 2024-04-20 NOTE — H&P (Signed)
   REFERRING PHYSICIAN:  No referring provider defined for this encounter.  DOS: 12/02/2023, right carpal tunnel release  HISTORY OF PRESENT ILLNESS: Tonya Ruiz is approximately 4 months out from her right sided carpal tunnel release.  Overall she states that she has done very well had minimal symptoms.  No longer getting any numbness and tingling in her right hand.  Continues to have left-sided symptoms.  She has been bracing for approximately 2 months and not having any improvement.  She has had the symptoms for approximately 6 months.  They wake her from sleep at least 2-3 times per night.  She continues to have worsening.  She feels like she is failing her conservative care.  PHYSICAL EXAMINATION:  NEUROLOGICAL:  General: In no acute distress.   Awake, alert, oriented to person, place, and time.  Pupils equal round and reactive to light.  Facial tone is symmetric.    Strength: Tinel and carpal compression test positive.  Phalen's positive.  Incision c/d/I  Imaging:  No imaging for this appointment.  EMG with bilateral carpal tunnel, right worse than left.  Previously documented.  Slowing across the wrist.  Assessment / Plan: Tonya Ruiz is doing very well after right sided carpal tunnel release.  This was done approximately 3 months ago.  She states that she has had a massive improvement in her symptoms.  She continues to have difficulty with the left side.  She has known left-sided carpal tunnel syndrome which is electrodiagnostically mild.  She has been dealing with the symptoms for approximately 6 months with worsening over the past 2 months.  She has been doing nightly brace therapy but has not had any significant improvement.  She does not want to go forward with any injections and would rather have a decompression procedure.  Since she has had symptoms for greater than 6 months, has had progressive symptoms in the spite of conservative management including nighttime wrist bracing and  continues to get nightly symptoms waking her at least 2-3 times at night this is interfering with her quality of life and her ability to rest after her full-time work.  She would like to go forward with a left-sided carpal tunnel release with ultrasound guidance.  We discussed the risks benefits and alternatives to management.  She would like to go forward.  We discussed nerve injury, failure to improve symptoms, need for further surgery, incomplete decompression, and pillar pain.    Advised to contact the office if any questions or concerns arise.   Tonya MICAEL Sharps, MD Dept of Neurosurgery

## 2024-04-20 NOTE — Op Note (Signed)
 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  Findings: Severe compression of the median nerve at the transverse carpal ligament  Preoperative Diagnosis:Left-sided carpal tunnel syndrome  Postoperative Diagnosis: Left-sided carpal tunnel syndrome   Postoperative Diagnosis: same   EBL: Minimal IVF: See anesthesia report Drains: none Disposition:Stable to PACU Complications: none  No foley catheter was placed.   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  They had tried rest, padding, and watchful waiting but had continued progressive symptoms.  Given the progression of her median neuropathy plan was made for median nerve decompression  Risk of surgery is discussed and include: Infection, bleeding, wound healing issues, pillar pain nerve injury, pain, failure to relieve the symptoms, need for further surgery.  Procedure:  1) left sided carpal tunnel decompression with ultrasound guidance   Procedure: After obtaining informed consent, the patient taken to the operating room, placed in supine position, monitored anesthesia care was induced.  They were given preoperative antibiotics.  Prepped and draped in the usual fashion.  Comprehensive timeout was performed verifying the patient's name, MRN, planned procedure.  An ultrasound was used with a sterile probe.  We used this to mark out our safety points including the interval between the ulnar artery and median nerve.  We identified the motor branch as well as the first sensory branch which were both in safe position.  We also identified the vascular arcade which was in safe positioning as well.  At this point we placed a block with Marcaine  without epinephrine .  We blocked the skin where the incision would be, the superficial sensory median nerve, as well as the  transverse carpal ligament.  Under ultrasound guidance we utilized the local anesthetic to perform a hydrodissection of the nerve from the transverse carpal ligament.  We then prepped the sonexs ultra CTR knife on the back table while the anesthetic set in.  We performed a small linear incision approximately 2 to 3 mm.  We then utilized a Statistician under ultrasound guidance to identify the underside of the transverse carpal ligament.  The fat and connective tissue was dissected off the underside.  We could feel that this was quite thickened and calcified.  Causing severe compression.  Once we had a clear tract we then placed the ultrasound-guided knife into the incision and advanced it through the carpal tunnel.  We verified the safety zones including the median nerve which did not significantly cross over the knife.  We are able to see the first sensory branch which was not crossing the knife.  The artery was also in a safe place.  At this point we divided the transverse carpal ligament under ultrasound guidance, to get a full release it took 2 passes.  The nerve relaxed laterally and was well decompressed.  After the 2 passes we placed a [place] 4 into the wound and were able to feel a complete dissection of the transverse carpal ligament.  We then irrigated, we got meticulous hemostasis.  Skin glue was placed on the incision and a Band-Aid was placed on top once this was dried.  No immediate complications.  Sponge and pattie counts were correct at the end of the procedure.   I performed the procedure without an assistant surgeon  Penne MICAEL Sharps, MD/MSCR

## 2024-04-20 NOTE — Anesthesia Preprocedure Evaluation (Signed)
 Anesthesia Evaluation  Patient identified by MRN, date of birth, ID band Patient awake    Reviewed: Allergy & Precautions, H&P , NPO status , Patient's Chart, lab work & pertinent test results, reviewed documented beta blocker date and time   History of Anesthesia Complications Negative for: history of anesthetic complications  Airway Mallampati: I  TM Distance: >3 FB Neck ROM: full    Dental  (+) Dental Advidsory Given, Caps, Teeth Intact, Missing   Pulmonary neg pulmonary ROS, former smoker   Pulmonary exam normal breath sounds clear to auscultation       Cardiovascular Exercise Tolerance: Good negative cardio ROS Normal cardiovascular exam Rhythm:regular Rate:Normal     Neuro/Psych negative neurological ROS  negative psych ROS   GI/Hepatic negative GI ROS, Neg liver ROS,,,  Endo/Other  negative endocrine ROS    Renal/GU      Musculoskeletal   Abdominal   Peds  Hematology negative hematology ROS (+)   Anesthesia Other Findings Past Medical History: No date: Allergy No date: Arthritis No date: Chicken pox 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   Reproductive/Obstetrics negative OB ROS                              Anesthesia Physical Anesthesia Plan  ASA: 2  Anesthesia Plan: General   Post-op Pain Management:    Induction: Intravenous  PONV Risk Score and Plan: 3 and Ondansetron , Dexamethasone , Propofol  infusion, TIVA and Midazolam   Airway Management Planned: Natural Airway and Nasal Cannula  Additional Equipment:   Intra-op Plan:   Post-operative Plan:   Informed Consent: I have reviewed the patients History and Physical, chart, labs and discussed the procedure including the risks, benefits and alternatives for the proposed anesthesia with the patient or authorized representative who has indicated his/her understanding and acceptance.     Dental Advisory Given  Plan  Discussed with: Anesthesiologist, CRNA and Surgeon  Anesthesia Plan Comments: (Patient consented for risks of anesthesia including but not limited to:  - adverse reactions to medications - risk of airway placement if required - damage to eyes, teeth, lips or other oral mucosa - nerve damage due to positioning  - sore throat or hoarseness - Damage to heart, brain, nerves, lungs, other parts of body or loss of life  Patient voiced understanding and assent.)        Anesthesia Quick Evaluation

## 2024-04-20 NOTE — Discharge Instructions (Addendum)

## 2024-04-20 NOTE — Transfer of Care (Signed)
 Immediate Anesthesia Transfer of Care Note  Patient: Tonya Ruiz  Procedure(s) Performed: Left-sided carpal tunnel release with ultrasound guidance (Left: Wrist)  Patient Location: PACU  Anesthesia Type:MAC  Level of Consciousness: awake, alert , and oriented  Airway & Oxygen Therapy: Patient Spontanous Breathing  Post-op Assessment: Report given to RN and Post -op Vital signs reviewed and stable  Post vital signs: Reviewed and stable  Last Vitals:  Vitals Value Taken Time  BP 101/59 04/20/24 07:38  Temp    Pulse 76 04/20/24 07:40  Resp 14 04/20/24 07:40  SpO2 97 % 04/20/24 07:40  Vitals shown include unfiled device data.  Last Pain:  Vitals:   04/20/24 0623  TempSrc: Temporal  PainSc: 0-No pain         Complications: No notable events documented.

## 2024-04-20 NOTE — Interval H&P Note (Signed)
 History and Physical Interval Note:  04/20/2024 7:05 AM  Tonya Ruiz  has presented today for surgery, with the diagnosis of Left-sided carpal tunnel syndrome.  The various methods of treatment have been discussed with the patient and family. After consideration of risks, benefits and other options for treatment, the patient has consented to  Procedure(s): Left-sided carpal tunnel release with ultrasound guidance (Left) as a surgical intervention.  The patient's history has been reviewed, patient examined, no change in status, stable for surgery.  I have reviewed the patient's chart and labs.  Questions were answered to the patient's satisfaction.    Heart and lungs are clear    [name]

## 2024-04-21 ENCOUNTER — Encounter: Payer: Self-pay | Admitting: Neurosurgery

## 2024-04-30 ENCOUNTER — Other Ambulatory Visit (HOSPITAL_COMMUNITY): Payer: Self-pay

## 2024-05-04 ENCOUNTER — Ambulatory Visit: Admitting: Physician Assistant

## 2024-05-04 VITALS — BP 102/64 | Temp 99.0°F | Ht 62.0 in | Wt 153.5 lb

## 2024-05-04 DIAGNOSIS — G5602 Carpal tunnel syndrome, left upper limb: Secondary | ICD-10-CM

## 2024-05-04 NOTE — Progress Notes (Signed)
   REFERRING PHYSICIAN:  Corwin Antu, Fnp [address] [place],  [place] [address]  DOS: 12/02/2023, right carpal tunnel release 04/20/24, left US  CTR  HISTORY OF PRESENT ILLNESS: Juhi Lagrange is approximately 5 months out from her right sided carpal tunnel release and 2 weeks status post left ultrasound-guided carpal tunnel release.  She is doing extremely well.  She has no more numbness in her hands or fingers.  Her strength has already improved.  She is pleased with her progress.   PHYSICAL EXAMINATION:  NEUROLOGICAL:  General: In no acute distress.   Awake, alert, oriented to person, place, and time.  Pupils equal round and reactive to light.  Facial tone is symmetric.    Strength: To baseline.  Some improvement in median intrinsic strength  Incision c/d/I  Imaging:  No imaging for this appointment.   Assessment / Plan: Karis Coburnis approximately 5 months out from her right sided carpal tunnel release and 2 weeks status post left ultrasound-guided carpal tunnel release.  She is doing extremely well.  Her numbness is gone.  Her strength has improved.  She is very pleased.  Plan to see back in approximately 1 month.  Advised to contact us  if she needs to be seen sooner.   Advised to contact the office if any questions or concerns arise.   Lyle Decamp, PA-C Dept of Neurosurgery

## 2024-05-26 ENCOUNTER — Encounter: Admitting: Neurosurgery

## 2024-06-02 ENCOUNTER — Encounter: Admitting: Neurosurgery
